# Patient Record
Sex: Female | Born: 1970 | Race: White | Hispanic: No | Marital: Married | Smoking: Never smoker
Health system: Southern US, Community
[De-identification: ages and names within clinical notes are randomized; demographics above are authoritative.]

## PROBLEM LIST (undated history)

## (undated) DIAGNOSIS — H9190 Unspecified hearing loss, unspecified ear: Secondary | ICD-10-CM

## (undated) DIAGNOSIS — G43909 Migraine, unspecified, not intractable, without status migrainosus: Secondary | ICD-10-CM

## (undated) DIAGNOSIS — T4145XA Adverse effect of unspecified anesthetic, initial encounter: Secondary | ICD-10-CM

## (undated) DIAGNOSIS — G5620 Lesion of ulnar nerve, unspecified upper limb: Secondary | ICD-10-CM

## (undated) DIAGNOSIS — M5020 Other cervical disc displacement, unspecified cervical region: Secondary | ICD-10-CM

## (undated) DIAGNOSIS — T8859XA Other complications of anesthesia, initial encounter: Secondary | ICD-10-CM

## (undated) HISTORY — DX: Migraine, unspecified, not intractable, without status migrainosus: G43.909

## (undated) HISTORY — DX: Lesion of ulnar nerve, unspecified upper limb: G56.20

## (undated) HISTORY — PX: WISDOM TOOTH EXTRACTION: SHX21

## (undated) HISTORY — DX: Other cervical disc displacement, unspecified cervical region: M50.20

## (undated) HISTORY — DX: Unspecified hearing loss, unspecified ear: H91.90

## (undated) HISTORY — PX: OTHER SURGICAL HISTORY: SHX169

---

## 2001-02-08 ENCOUNTER — Other Ambulatory Visit: Admission: RE | Admit: 2001-02-08 | Discharge: 2001-02-08 | Payer: Self-pay | Admitting: Obstetrics & Gynecology

## 2001-04-23 ENCOUNTER — Encounter: Payer: Self-pay | Admitting: Obstetrics and Gynecology

## 2001-04-23 ENCOUNTER — Ambulatory Visit (HOSPITAL_COMMUNITY): Admission: RE | Admit: 2001-04-23 | Discharge: 2001-04-23 | Payer: Self-pay | Admitting: Obstetrics and Gynecology

## 2001-09-08 ENCOUNTER — Inpatient Hospital Stay (HOSPITAL_COMMUNITY): Admission: AD | Admit: 2001-09-08 | Discharge: 2001-09-08 | Payer: Self-pay | Admitting: Obstetrics & Gynecology

## 2001-09-12 ENCOUNTER — Inpatient Hospital Stay (HOSPITAL_COMMUNITY): Admission: AD | Admit: 2001-09-12 | Discharge: 2001-09-16 | Payer: Self-pay | Admitting: Obstetrics & Gynecology

## 2002-06-13 ENCOUNTER — Other Ambulatory Visit: Admission: RE | Admit: 2002-06-13 | Discharge: 2002-06-13 | Payer: Self-pay | Admitting: Obstetrics & Gynecology

## 2004-08-18 ENCOUNTER — Other Ambulatory Visit: Admission: RE | Admit: 2004-08-18 | Discharge: 2004-08-18 | Payer: Self-pay | Admitting: Obstetrics & Gynecology

## 2005-05-16 HISTORY — PX: BREAST SURGERY: SHX581

## 2005-05-16 HISTORY — PX: AUGMENTATION MAMMAPLASTY: SUR837

## 2005-06-27 ENCOUNTER — Other Ambulatory Visit: Admission: RE | Admit: 2005-06-27 | Discharge: 2005-06-27 | Payer: Self-pay | Admitting: Obstetrics & Gynecology

## 2006-10-23 ENCOUNTER — Inpatient Hospital Stay (HOSPITAL_COMMUNITY): Admission: AD | Admit: 2006-10-23 | Discharge: 2006-10-23 | Payer: Self-pay | Admitting: Obstetrics and Gynecology

## 2006-10-23 ENCOUNTER — Encounter (INDEPENDENT_AMBULATORY_CARE_PROVIDER_SITE_OTHER): Payer: Self-pay | Admitting: Obstetrics and Gynecology

## 2007-10-24 ENCOUNTER — Inpatient Hospital Stay (HOSPITAL_COMMUNITY): Admission: RE | Admit: 2007-10-24 | Discharge: 2007-10-27 | Payer: Self-pay | Admitting: Obstetrics & Gynecology

## 2007-10-30 ENCOUNTER — Ambulatory Visit: Admission: RE | Admit: 2007-10-30 | Discharge: 2007-10-30 | Payer: Self-pay | Admitting: Obstetrics & Gynecology

## 2008-03-27 ENCOUNTER — Emergency Department (HOSPITAL_COMMUNITY): Admission: EM | Admit: 2008-03-27 | Discharge: 2008-03-27 | Payer: Self-pay | Admitting: Emergency Medicine

## 2008-04-27 IMAGING — US US OB TRANSVAGINAL MODIFY
1 series · 14 of 28 positions shown · non-contrast
Comparison: none

CLINICAL DATA: 10 weeks pregnant. Heavy vaginal bleeding

TRANSABDOMINAL AND TRANSVAGINAL OB ULTRASOUND, LESS THAN 14 WEEKS:

[Series 1: us ob transvaginal modify · 0.22mm/px · 14 of 33 slices shown]
[im 2/33]
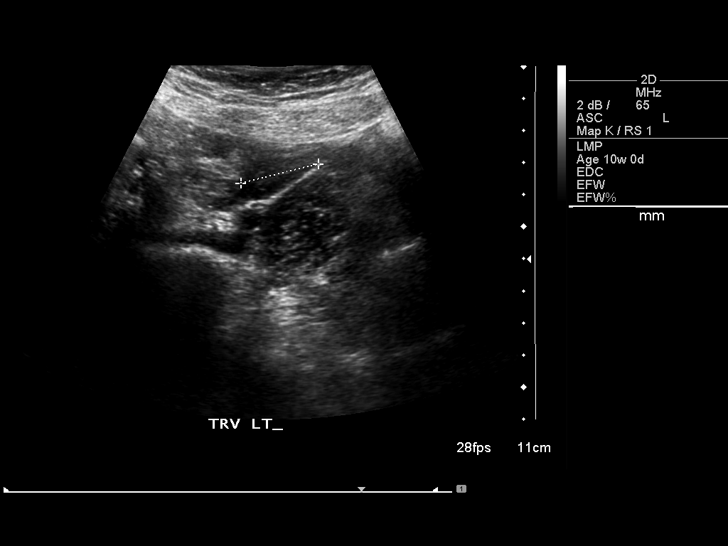
[im 4/33]
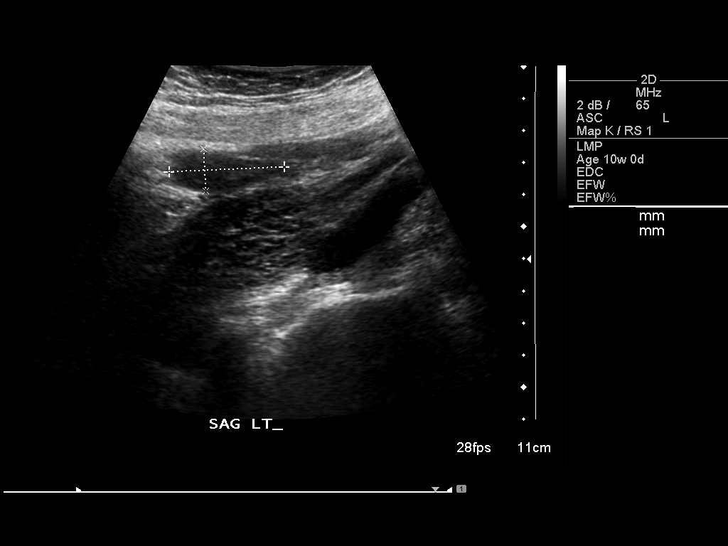
[im 6/33]
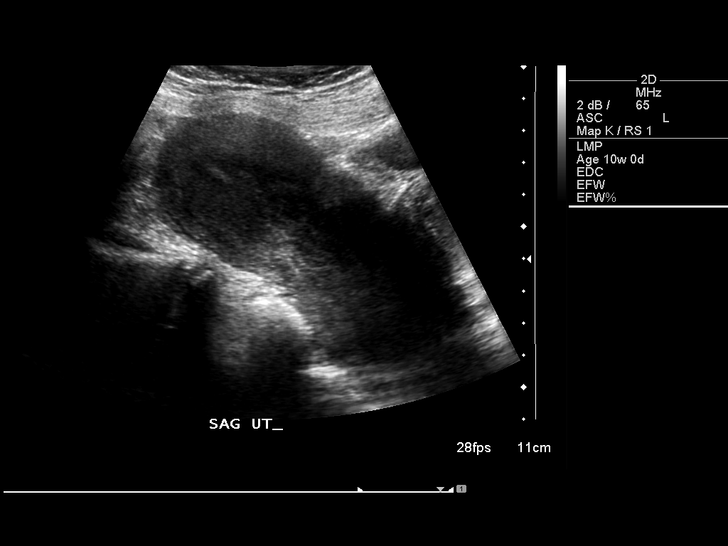
[im 9/33]
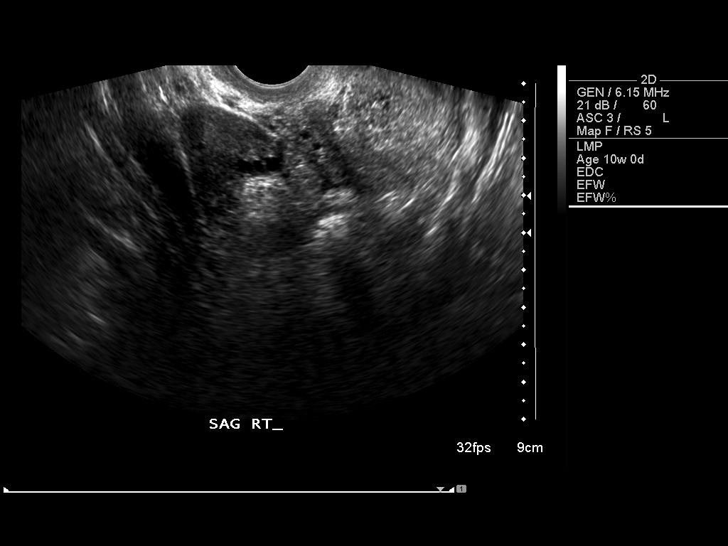
[im 11/33]
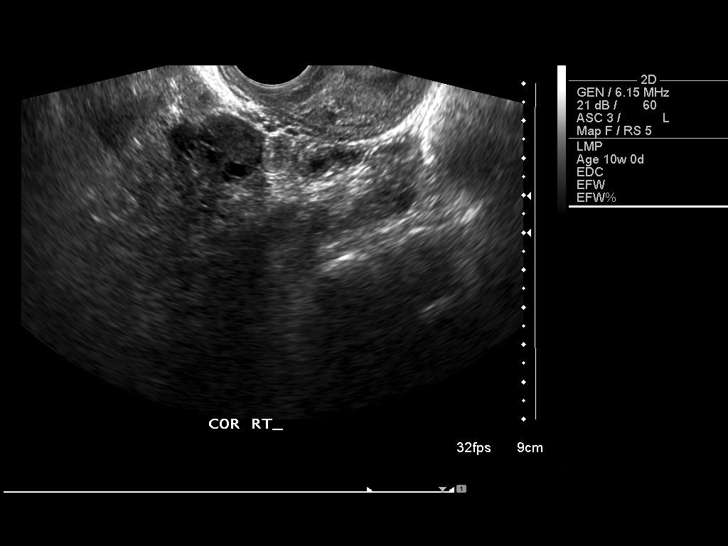
[im 14/33]
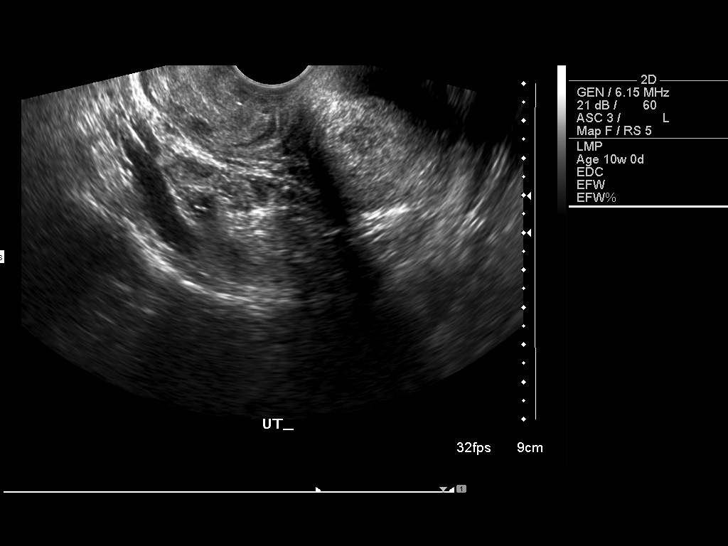
[im 16/33]
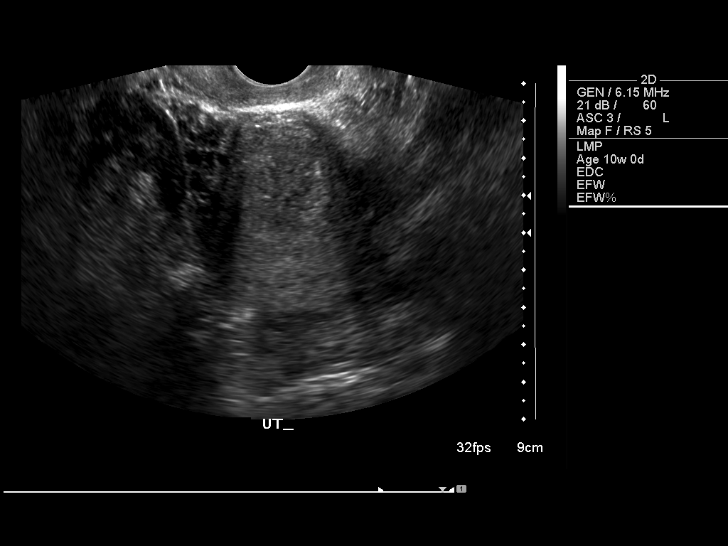
[im 18/33]
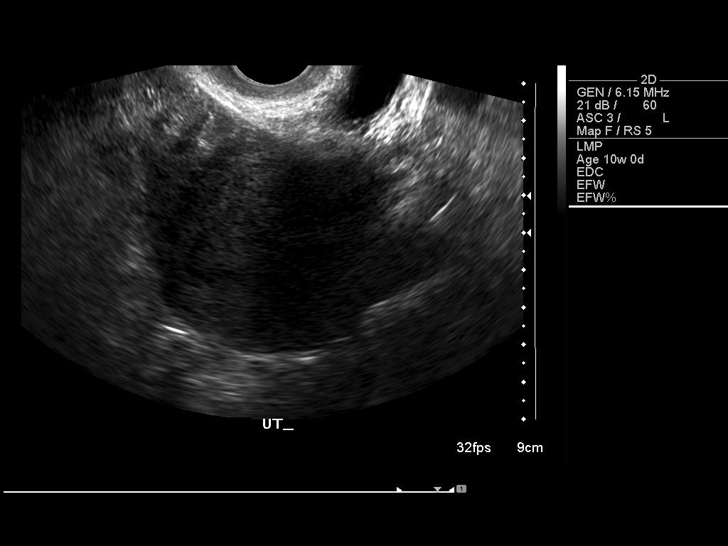
[im 21/33]
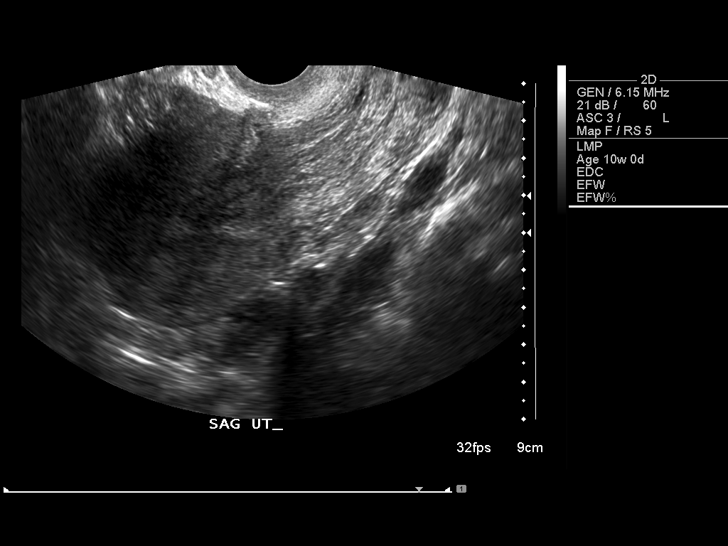
[im 23/33]
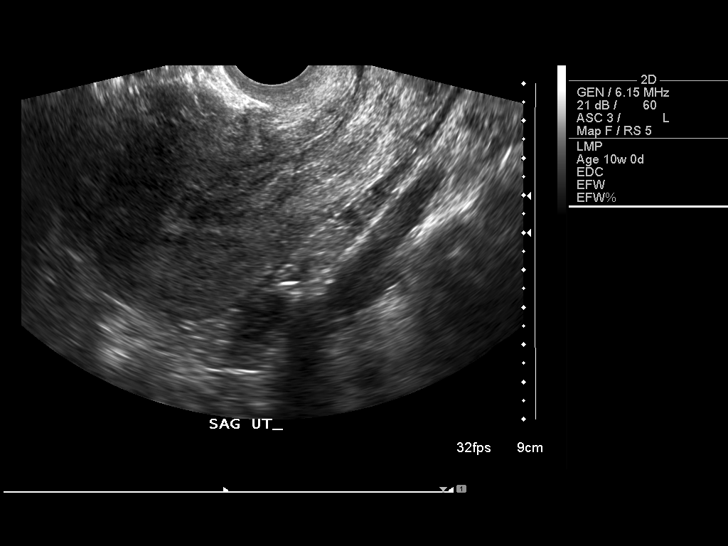
[im 25/33]
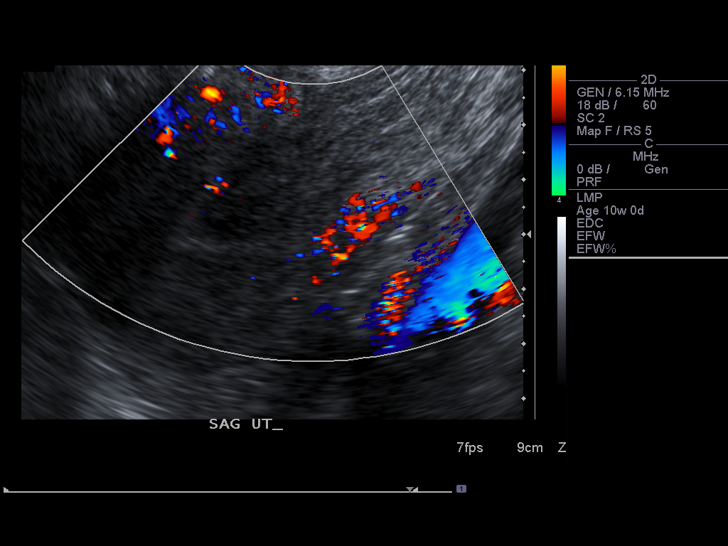
[im 28/33]
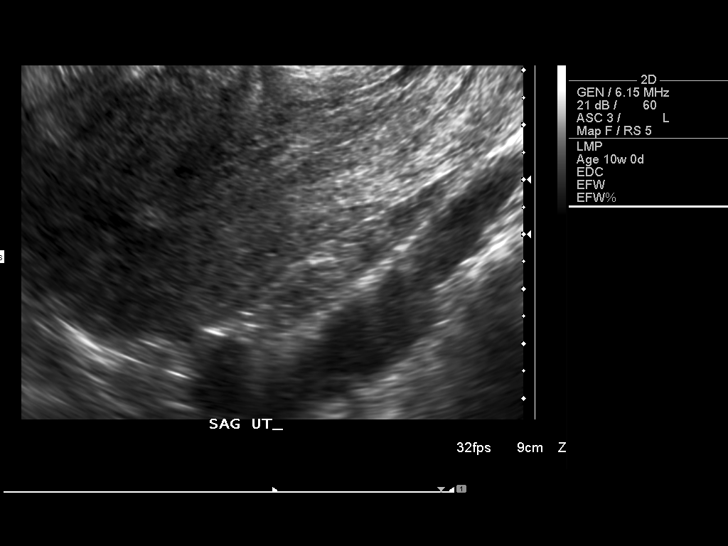
[im 30/33]
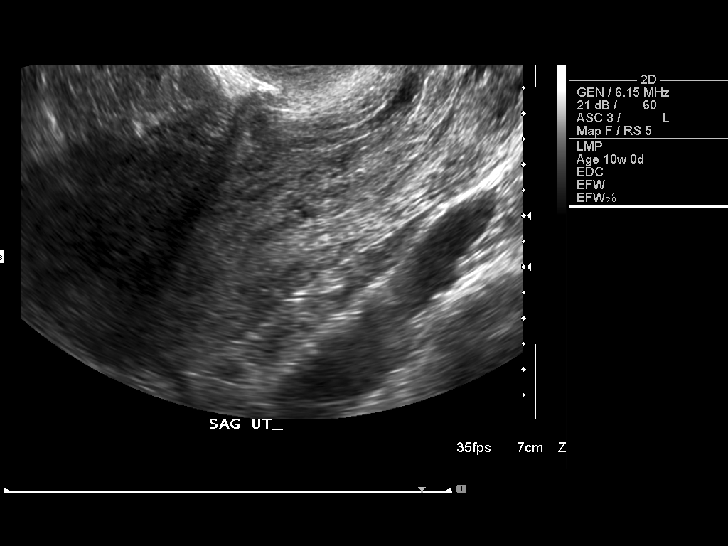
[im 33/33]
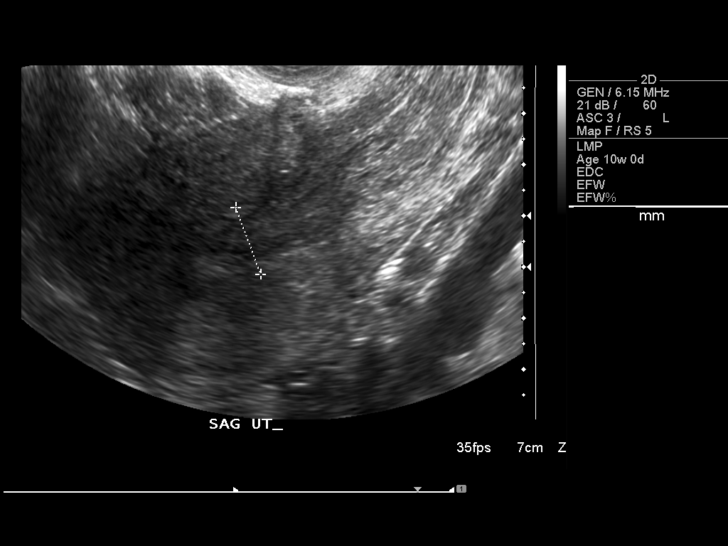

[14 of 28 positions shown; findings below may reference images not displayed]

FINDINGS: There is no intrauterine gestational sac identified. Endometrium
mildly thickened and heterogeneous at 16 mm.

Ovaries unremarkable. No free fluid.
IMPRESSION: No intrauterine gestation identified. Endometrium mildly thickened at 16 mm.
Recommend correlation with quantitative beta-hCG.

## 2009-09-30 IMAGING — CR DG ABDOMEN ACUTE W/ 1V CHEST
3 series · 3 of 3 positions shown · non-contrast
Comparison: None available.

CLINICAL DATA: Left lower quadrant pain.

ACUTE ABDOMEN SERIES (ABDOMEN 2 VIEW & CHEST 1 VIEW)

[w chest pa]
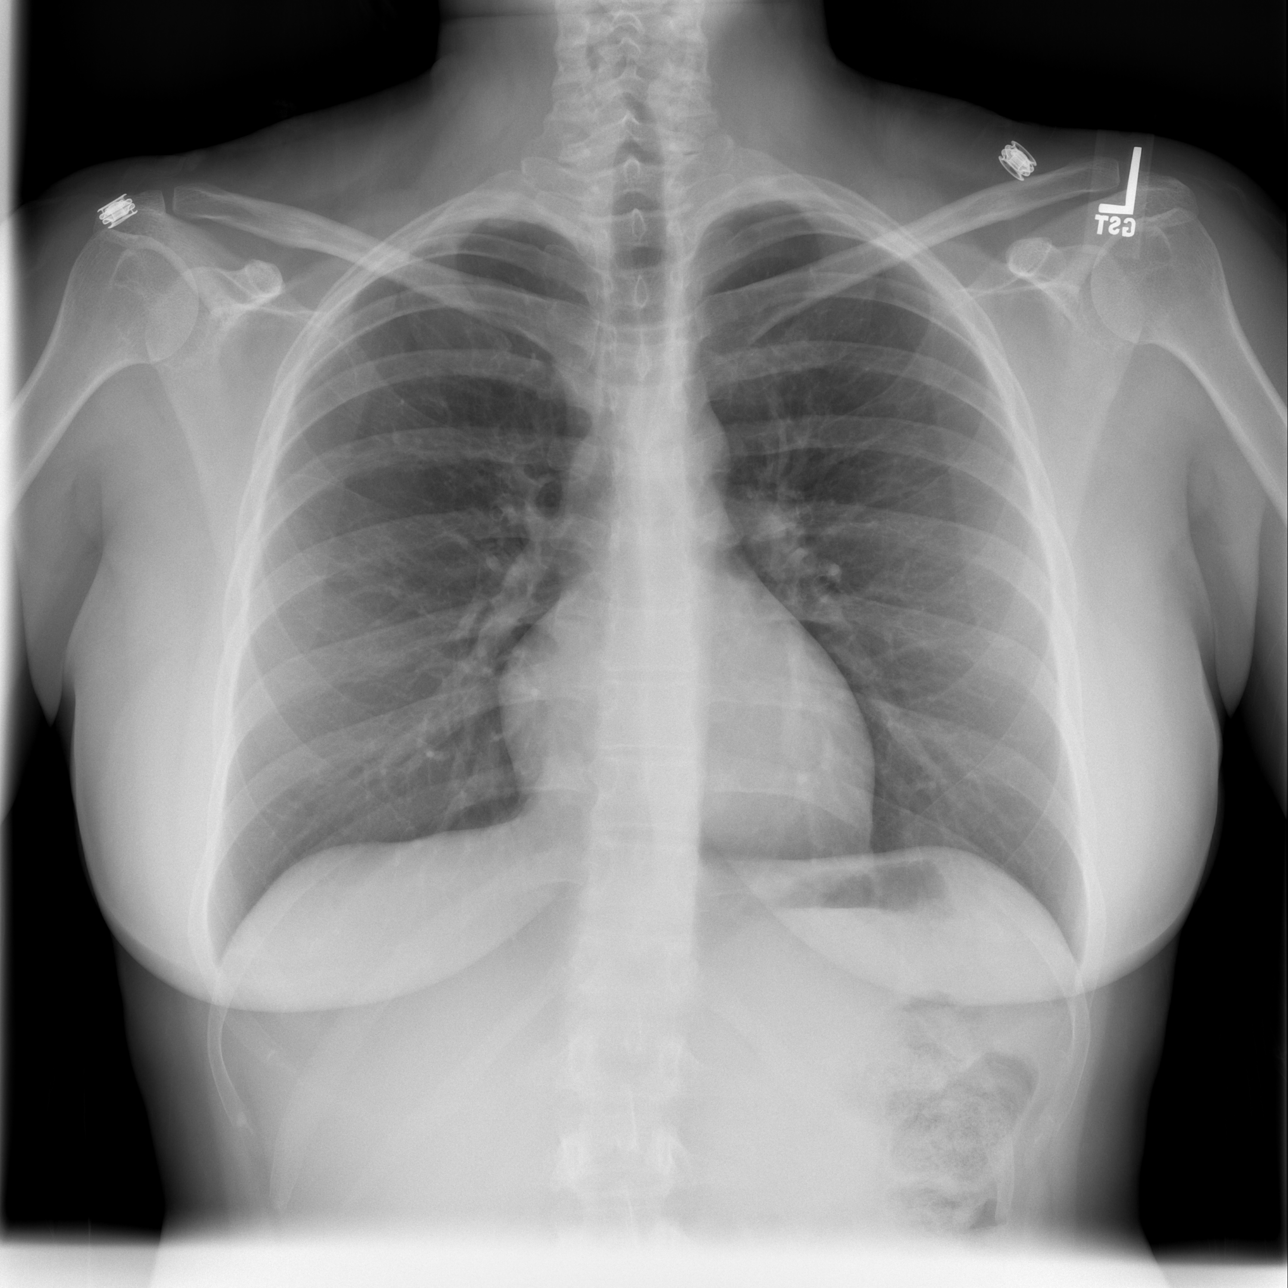

[w abdomen upright]
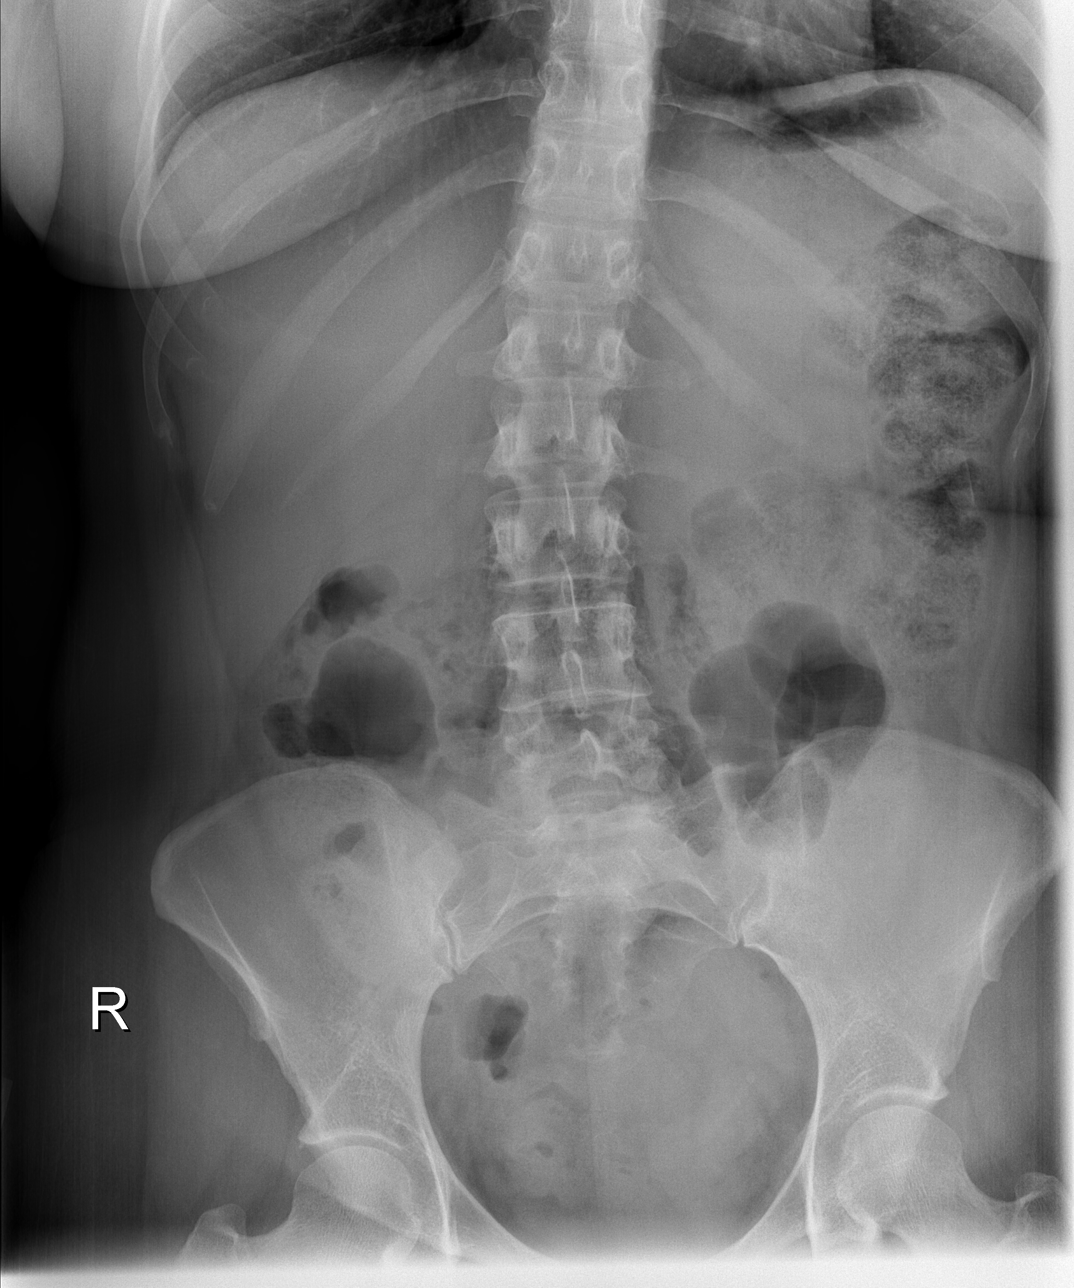

[t abdomen supine]
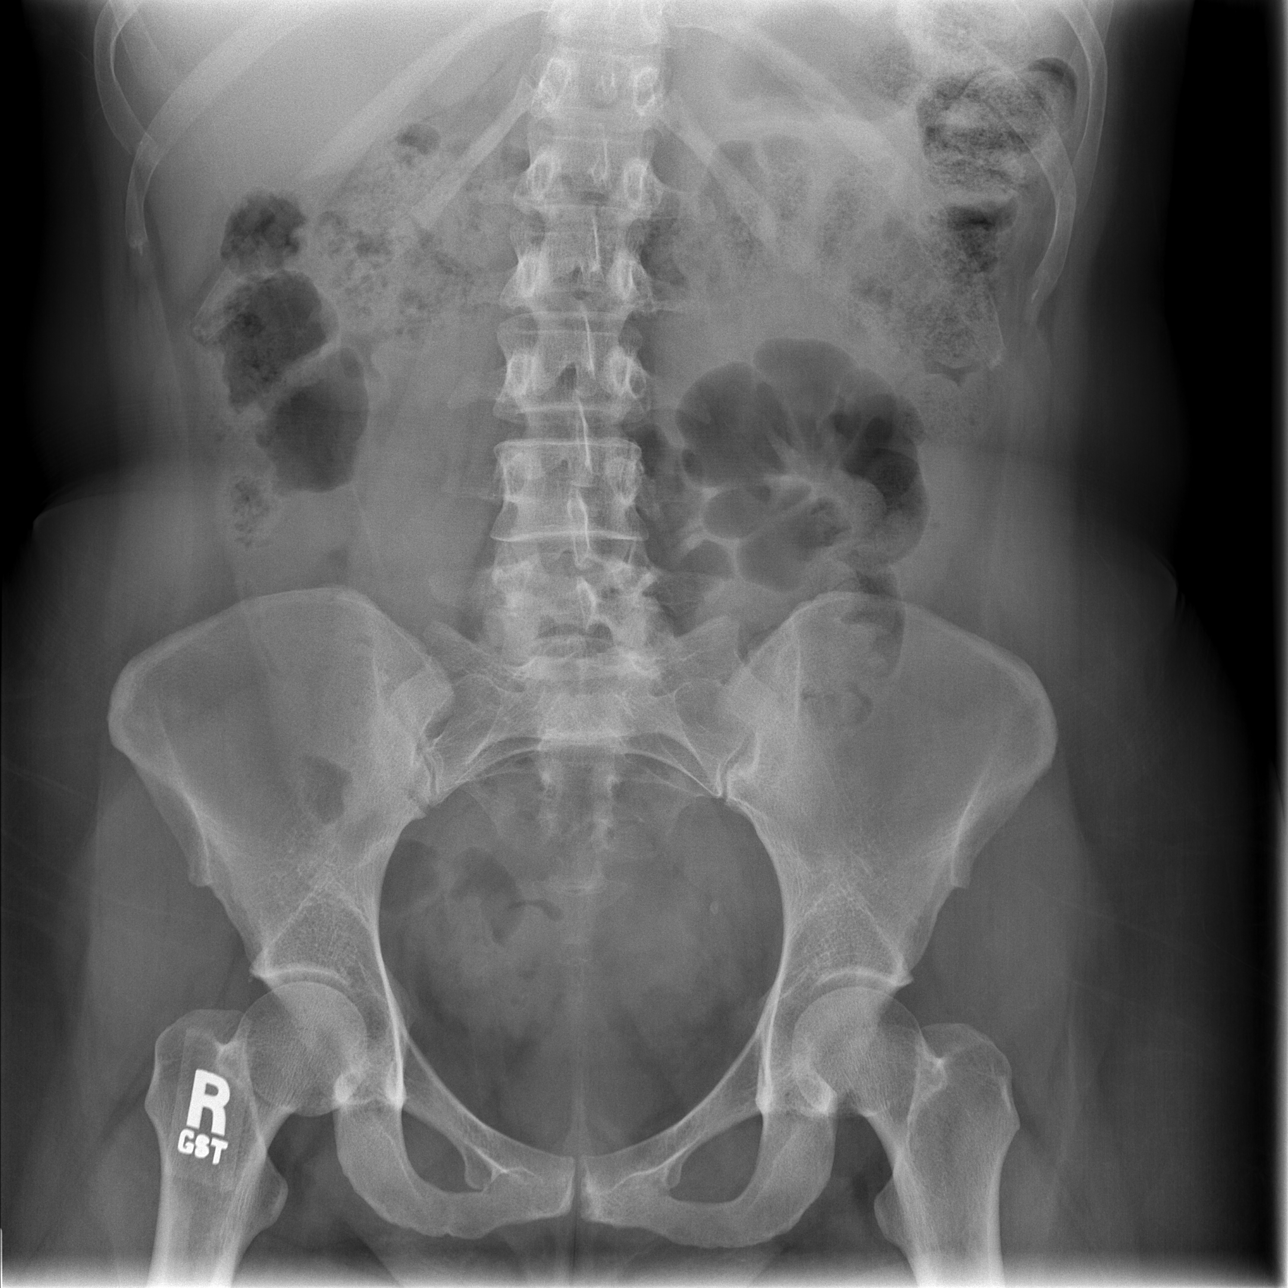

[3 of 3 positions shown; findings below may reference images not displayed]

FINDINGS: Single view of the chest demonstrates clear lungs and no
pleural effusion.  Heart size normal.

Two views the abdomen demonstrate no free intraperitoneal air.
Bowel gas pattern is unremarkable.
IMPRESSION: No acute finding.

## 2010-09-28 NOTE — Op Note (Signed)
Candace Mendez, Candace Mendez                ACCOUNT NO.:  [id number]   MEDICAL RECORD NO.:  [id number]          PATIENT TYPE:  INP   LOCATION:  9199                          FACILITY:  [facility]   PHYSICIAN:  Gerrit Friends. Aldona Bar, M.D.   DATE OF BIRTH:  1970-07-29   DATE OF PROCEDURE:  10/24/2007  DATE OF DISCHARGE:                               OPERATIVE REPORT   PREOPERATIVE DIAGNOSES:  1. Term pregnancy.  2. Previous cesarean section.  3. Desire for repeat cesarean section.   POSTOPERATIVE DIAGNOSES:  1. Term pregnancy.  2. Previous cesarean section.  3. Desire for repeat cesarean section.  4. Delivery of 7 pounds 12 ounces female infant with Apgars of 9 and      9.   PROCEDURE:  Repeat low-transverse cesarean section.   SURGEON:  Gerrit Friends. Aldona Bar, MD   ASSISTANT:  Luvenia Redden, MD   ANESTHESIA:  Spinal by Dr. Harvest Forest.   HISTORY:  This 40 year old patient, a gravida 5, para 1-0-4-3 is being  taken to the operating room at 39-40 weeks' gestation for repeat  cesarean section after having a relatively benign pregnancy.   PROCEDURE IN DETAIL:  Once in the operating room, spinal anesthetic was  carried out by Dr. Harvest Forest without difficulty and thereafter the patient  was prepped and draped in the usual fashion with a Foley catheter  inserted for the prep.   After the patient was appropriately draped and positioned and good  anesthetic levels were documented, procedure was begun.   A Pfannenstiel incision was made dissecting down to and through the old  scar without difficulty.  Subcu tissue was rendered hemostatic.  Fascia  incised in low-transverse fashion and subfascial space created  inferiorly and superiorly with hemostasis created as well.  Peritoneum  was identified appropriately and entered with care taken to avoid the  bowel superiorly and the bladder inferiorly.  Vesicouterine peritoneum  at this time was incised in a low-transverse fashion, pushed off the  lower uterine segment with  ease, and then sharp incision into the uterus  with the Metzenbaum scissors was made and extended laterally.  Amniotomy  produced and thereafter with delivery was expedited with the aid of the  vacuum extractor and the patient was delivered with 7 pounds 12 ounces  female infant with Apgars of 9 and 9 from vertex position.  Cord was  clamped and cut.  The infant was passed off to the waiting team and  subsequently taken to the nursery in good condition.  Cord bloods were  collected.  Placenta was delivered intact and appeared normal.  At this  time, the uterus was exteriorized, rendered free of any remaining  products of conception, and good uterine contractility was afforded with  slowly given intravenous Pitocin and manual stimulation.  At this time,  the uterine incision was closed using a single layer of #1 Vicryl in a  running locking fashion with excellent results.  Tubes and ovaries were  inspected and noted to be normal.  Abdomen lavaged of all free blood and  clot, and incision reinspected and noted to be  dry.  At this time, the  uterus was replaced in the abdominal incision after all counts were  noted to be correct and no foreign bodies were noted through the  remaining abdominal cavity.  Closure of the abdomen was carried out in  layers.  The abdominal peritoneum was closed with 0 Vicryl in a running  fashion and muscle secured with same.  Assured of good fascial  hemostasis, the fascia was then reapproximated using 0 Vicryl from angle  of midline bilaterally.  Subcutaneous tissues were hemostatic.  Staples  were then used to close the skin.  A sterile pressure dressing was  applied and the patient at this time was transported to recovery room in  satisfactory condition having tolerated the procedure well.  Estimated  blood loss 500 mL.  All counts correct x2.  At conclusion of procedure,  both mother and baby were doing well in the respective recovery areas.   In summary,  this patient presented at term, desires repeat cesarean  section, was taken to the operating room, and was delivered with 7  pounds 12 ounces female infant with Apgars of 9 and 9.      Gerrit Friends. Aldona Bar, M.D.  Electronically Signed     RMW/MEDQ  D:  10/24/2007  T:  10/24/2007  Job:  [id number]

## 2010-10-01 NOTE — Discharge Summary (Signed)
Candace Mendez, Candace Mendez                ACCOUNT NO.:  [id number]   MEDICAL RECORD NO.:  [id number]          PATIENT TYPE:  INP   LOCATION:  9142                          FACILITY:  [facility]   PHYSICIAN:  Malva Limes, M.D.    DATE OF BIRTH:  10/05/1970   DATE OF ADMISSION:  10/24/2007  DATE OF DISCHARGE:  10/27/2007                               DISCHARGE SUMMARY   FINAL DIAGNOSES:  1. Intrauterine pregnancy at term.  2. History of previous cesarean section, the patient desires repeat      cesarean section.   PROCEDURE:  Repeat low-transverse cesarean section.   SURGEON:  Dr. Gerrit Friends. Aldona Bar, MD.   ASSISTANT:  Dr. Tammy Sours.   COMPLICATIONS:  None.   This 40 year old G5, P 1-0-4-3 presents at term for repeat cesarean  section.  The patient had a prior cesarean section with her last  pregnancy and desires a repeat with this pregnancy as well.  The  patient's antepartum course up to this point had been complicated by  advanced maternal age.  She declined all genetic testing.  Otherwise,  her antepartum course was uncomplicated.  She did have a negative group  B strep culture obtained in our office at 35 weeks.  The patient was  taken to the operating room on October 24, 2007, by Dr. Annamaria Helling where a  repeat low-transverse cesarean section was performed with the delivery  of a 7 pounds 12 ounces female infant with Apgars of 9 and 9.  Delivery  went without complications.  The patient's postoperative course was  benign without any significant fevers.  She did have some postoperative  anemia and was started on iron with her discharge.  The patient was felt  ready for discharge on postoperative day #3.  She was sent home on a  regular diet, told to decrease activities, told to continue her  vitamins, and her iron supplement daily.  She was given Percocet 1-2  every 4-6 hours as needed for her pain.  Instructions and precautions  were reviewed with the patient.   LABS ON DISCHARGE:  The  patient had a hemoglobin of 7.2, white blood  cell count of 9.6, and platelets of 131,000 that was done on October 25, 2007.      Leilani Able, P.A.-C.    ______________________________  Malva Limes, M.D.    MB/MEDQ  D:  11/26/2007  T:  11/27/2007  Job:  [id number]

## 2010-10-01 NOTE — Discharge Summary (Signed)
[facility]  Patient:    [name], [name] Visit Number: [id number] MRN: [id number]          Service Type: OBS Location: 910A 9145 01 Attending Physician:  Lars Pinks Dictated by:   Leilani Able, P.A. Admit Date:  09/12/2001 Discharge Date: 09/16/2001                             Discharge Summary  FINAL DIAGNOSES:              1. Intrauterine pregnancy at term.                               2. Nonreassuring fetal heart tracing.  PROCEDURE:                    Primary low transverse cesarean section.  SURGEON:                      Gerrit Friends. Aldona Bar, M.D.  COMPLICATIONS:                None.  HOSPITAL COURSE:              This 40 year old, G1, P0 was admitted on the evening of September 12, 2001 in early active labor.  The patient was checked on the morning of Sep 13, 2001 and her cervix was essentially unchanged.  She was given the option of induction versus discharge to home and she chose the former. The patient underwent amniotomy with production of clear fluid and pressure catheters were placed. She began having good contractions at this point and progressed to about 6 cm dilation by 2:30 that afternoon.  A couple of hours later, the patient was noted to have some moderate variable decelerations with each contraction that was unresponsive to position change. At this point her cervix was still 6 cm dilated and negative 2 to negative 3 station.  Because of a concern for nonreassuring fetal heart tracing unresponsive to position change and fluid decision was made to proceed with a cesarean section.  The patient was taken to the operating room by Dr. Annamaria Helling on Sep 13, 2001 where a primary low transverse cesarean section was performed with the delivery of an 8 pound 4 ounce female infant with Apgars of 7 and 9.  Procedure went without complication.  The patients postoperative course was benign without significant fevers.  The patient did  have some postoperative anemia and was started on iron in the hospital.  A circumcision was made on the little boy and the patient was felt ready for discharge on postoperative day three.  She was sent home on a regular diet and told to decrease activities.  She was told to continue prenatal vitamins and FESO4 was given.  A prescription for Percocet and ibuprofen.  She was to follow up in the office in four weeks and to call with any increased pain or bleeding. Dictated by:   Leilani Able, P.A. Attending Physician:  Lars Pinks DD:  10/15/01 TD:  10/17/01 Job: [id number]

## 2010-10-01 NOTE — Op Note (Signed)
[facility]  Patient:    Candace Mendez, Candace Mendez Visit Number: [id number] MRN: [id number]          Service Type: OBS Location: 910A 9145 01 Attending Physician:  Lars Pinks Dictated by:   Gerrit Friends. Aldona Bar, M.D. Proc. Date: 09/13/01 Admit Date:  09/12/2001                             Operative Report  PREOPERATIVE DIAGNOSES:       Nonreassuring fetal heart tracing.  POSTOPERATIVE DIAGNOSES:      Nonreassuring fetal heart tracing, delivery of 8 pound 4 ounce female infant, Apgars 8 and 9.  PROCEDURE:                    Primary low transverse cesarean section.  SURGEON:                      Gerrit Friends. Aldona Bar, M.D.  ANESTHESIA:                   Spinal.  HISTORY:                      This primigravida was admitted on the evening of April 30 in what was felt to be early active labor.  When checked on the morning of May 1 her cervix essentially was unchanged.  She measured 41 cm and she was given the option of induction versus discharge to home.  The chose the former.  She underwent amniotomy with production of clear fluid and a pressure catheter was placed.  She began having, shortly thereafter, good contractions. She progressed to approximately 6 cm of dilatation at about 2:30 p.m.  I was called to see the patient at about 4:10 p.m. at which time she had been having moderate variable decelerations with each contraction unresponsive to position change and at that time her cervix essentially was unchanged from what it was around 2:30 p.m., still 6 cm dilated, 70% effaced, with a vertex at -2/-3 station.  Because of a concern for nonreassuring fetal heart tracing unresponsive to position change and fluid bolus, decision was made to recommend primary low transverse cesarean section for delivery which was accepted by the patient.  She was taken to the operating room for such procedure.  In the operating room she was prepped and draped after a spinal  anesthetic was placed by Dr. ______.  A Foley catheter was placed as well as part of the prep.  After the patient was appropriately positioned and the anesthetic level was checked, procedure was begun.  A Pfannenstiel incision was made and dissected down sharply to and through the fascia in a low transverse fashion without difficulty.  Subfascial space was created inferiorly and superiorly.  Muscles separated in the midline. Peritoneum identified and entered appropriately with care taken to avoid the bowel superiorly and the bladder inferiorly.  At this time the vesicouterine peritoneum was incised in a low transverse fashion and pushed off the lower uterine segment with ease.  Sharp ______ of the uterus to Metzenbaum scissors was then carried out in a low transverse fashion, extended laterally with the fingers.  Thereafter with the aid of the vacuum extractor, a viable female infant which cried spontaneously at once was delivered.  There was some difficulty with the shoulders.  After the cord was clamped and cut the infant was passed off  the awaiting team.  Apgars were noted to be 8 and 9.  The baby was ultimately taken to the nursery in good condition.  Weight was found to be 8 pounds 4 ounces.  No evidence of a cord problem was seen at the time of delivery.  Once the placenta was delivered intact the uterus was manually explored, found to be negative for any remaining products of conception and the uterus was exteriorized to aid in closure of the uterine incision.  Good uterine contractility was afforded with slowly given intravenous Pitocin and manual stimulation.  The uterine incision was closed with a single layer of #1 Vicryl in a running locking fashion and oversewn with several figure-of-eight #1 Vicryl.  Hemostasis was noted to be adequate.  Tubes and ovaries were noted to be normal.  Uterus was well contracted.  Incision was dry.  At this time the incision was replaced into  the abdominal cavity after the abdomen was lavaged of all free blood and clot.  At this point all counts were noted to be correct and no foreign bodies were noted to be remaining in the abdominal cavity. Closure of the abdomen was begun at this time in layers.  The abdominoperitoneum was closed with 0 Vicryl in a running fashion.  Muscles were secured with same.  Assured of good subfascial hemostasis, the fascia was then reapproximated using 0 Vicryl flank with midline bilaterally. Subcutaneous tissue was rendered hemostatic and staples were then used to close the skin.  Sterile pressure dressing was applied and the patient at this time was transported to recovery in satisfactory condition having tolerated procedure well.  ESTIMATED BLOOD LOSS:         500 cc.  COUNTS:                       Correct x2.  SUMMARY:                      This patient progressed to about 6 cm of dilatation with no further change for about two hours and for the last hour prior to being taken to the operating room was having some moderately significant variables which seemed to be totally unresponsive to position change and fluid bolus.  Because of nonreassuring tracing she was taken to the operating room for primary low transverse cesarean section.  At the conclusion of the procedure both mother and baby were doing well in their respective recovery areas. Dictated by:   Gerrit Friends. Aldona Bar, M.D. Attending Physician:  Lars Pinks DD:  09/13/01 TD:  09/16/01 Job: [id number]

## 2010-11-15 NOTE — H&P (Signed)
Candace Mendez is a 40 y.o. female presenting for repeat C-section at Term. Maternal Medical History:  Reason for admission: Presenting for Repeat C-section at Term.  Fetal activity: Perceived fetal activity is normal.    Prenatal complications: no prenatal complications Prenatal Complications - Diabetes: none.    OB History    No data available     No past medical history on file. No past surgical history on file. Family History: family history is not on file. Social History:  does not have a smoking history on file. She does not have any smokeless tobacco history on file. Her alcohol and drug histories not on file.  Review of Systems  Constitutional: Negative.   HENT: Negative.   Eyes: Negative.   Respiratory: Negative.   Cardiovascular: Negative.   Gastrointestinal: Negative.   Genitourinary: Negative.   Musculoskeletal: Negative.   Skin: Negative.   Neurological: Negative.   Endo/Heme/Allergies: Negative.   Psychiatric/Behavioral: Negative.      There were no vitals taken for this visit. Maternal Exam:  Uterine Assessment: No contractions  Abdomen: Patient reports no abdominal tenderness. Surgical scars: low transverse.   Fundal height is S+37.   Estimated fetal weight is 7 pounds.   Fetal presentation: vertex  Introitus: Normal vulva. Normal vagina.  Ferning test: not done.  Nitrazine test: not done. Amniotic fluid character: not assessed.  Cervix: not evaluated.   Physical Exam  Constitutional: She is oriented to person, place, and time. She appears well-developed and well-nourished.  HENT:  Head: Normocephalic.  Eyes: EOM are normal. Pupils are equal, round, and reactive to light.  Neck: Normal range of motion. Neck supple.  Cardiovascular: Normal rate, regular rhythm, normal heart sounds and intact distal pulses.   Respiratory: Effort normal and breath sounds normal.  GI: Soft. Bowel sounds are normal.  Genitourinary: Vagina normal and uterus  normal.  Musculoskeletal: Normal range of motion.  Neurological: She is alert and oriented to person, place, and time. She has normal reflexes.  Skin: Skin is warm and dry.  Psychiatric: She has a normal mood and affect. Her behavior is normal. Judgment and thought content normal.    Prenatal labs: ABO, Rh:   Antibody:   Rubella:   RPR:    HBsAg:    HIV:    GBS:     Assessment/Plan: Previous C-Section at Term/Repeat C-Section  Orbin Mayeux M 11/15/2010, 8:52 PM

## 2010-11-16 ENCOUNTER — Encounter (HOSPITAL_COMMUNITY): Payer: Self-pay | Admitting: Obstetrics & Gynecology

## 2010-11-23 ENCOUNTER — Other Ambulatory Visit (HOSPITAL_COMMUNITY): Payer: Self-pay

## 2010-11-23 ENCOUNTER — Encounter (HOSPITAL_COMMUNITY): Payer: Self-pay | Admitting: Obstetrics & Gynecology

## 2010-11-23 NOTE — H&P (Addendum)
MIYONNA ORMISTON is a 40 y.o. female G7 P2 (2previous C/S) presenting for repeat C/S at Term.  Her prenatal course has been totally benign. History  Family History: Non contributory Social History: Non Contributory   Review of Systems  Constitutional: Negative.   HENT: Negative.   Eyes: Negative.   Respiratory: Negative.   Cardiovascular: Negative.   Gastrointestinal: Negative.   Genitourinary: Negative.   Musculoskeletal: Negative.   Skin: Negative.   Neurological: Negative.   Psychiatric/Behavioral: Negative.       VS:  BP 100/60, P 80, R 18, Weight 157 lbs.  Maternal Exam:  Abdomen: Patient reports no abdominal tenderness. Surgical scars: low transverse.   Fundal height is 37.   Estimated fetal weight is 7 POUNDS.   Fetal presentation: vertex  Introitus: Normal vulva. Normal vagina.  Cervix: Cervix evaluated by digital exam.     Physical Exam  Constitutional: She is oriented to person, place, and time. She appears well-developed and well-nourished.  HENT:  Head: Normocephalic.  Eyes: Conjunctivae and EOM are normal. Pupils are equal, round, and reactive to light.  Neck: Normal range of motion. Neck supple.  Cardiovascular: Normal rate, regular rhythm, normal heart sounds and intact distal pulses.   Respiratory: Effort normal and breath sounds normal.  GI: Soft. Bowel sounds are normal.  Genitourinary: Vagina normal and uterus normal.  Musculoskeletal: Normal range of motion.  Neurological: She is alert and oriented to person, place, and time. She has normal reflexes.  Skin: Skin is warm and dry.  Psychiatric: She has a normal mood and affect. Her behavior is normal. Judgment and thought content normal.    Prenatal labs: ABO, Rh: O+  Antibody:  Neg Rubella:  Immune RPR:  NR  HBsAg:   Neg HIV:   NR GBS:  Neg   Assessment/Plan: Term Pregnancy, Previous C/S times 2.   Caprice Beaver 11/23/2010, 1:59 PM

## 2010-11-24 ENCOUNTER — Encounter (HOSPITAL_COMMUNITY): Payer: Self-pay

## 2010-11-24 ENCOUNTER — Encounter (HOSPITAL_COMMUNITY)
Admission: RE | Admit: 2010-11-24 | Discharge: 2010-11-24 | Disposition: A | Discharge status: Home / Self Care | Payer: 59 | Source: Ambulatory Visit | Attending: Obstetrics & Gynecology | Admitting: Obstetrics & Gynecology

## 2010-11-24 DIAGNOSIS — Z331 Pregnant state, incidental: Secondary | ICD-10-CM

## 2010-11-24 HISTORY — DX: Adverse effect of unspecified anesthetic, initial encounter: T41.45XA

## 2010-11-24 HISTORY — DX: Other complications of anesthesia, initial encounter: T88.59XA

## 2010-11-24 LAB — CBC
HCT: 35.5 % — ABNORMAL LOW (ref 36.0–46.0)
Hemoglobin: 12.2 g/dL (ref 12.0–15.0)
MCH: 31.3 pg (ref 26.0–34.0)
MCV: 91 fL (ref 78.0–100.0)
RBC: 3.9 MIL/uL (ref 3.87–5.11)

## 2010-11-24 NOTE — Anesthesia Preprocedure Evaluation (Signed)
Anesthesia Evaluation  General Assessment Comment  History of Anesthesia Complications (+) PONV  Airway Mallampati: I TM Distance: >3 FB Neck ROM: Full    Dental No notable dental hx    Pulmonaryneg pulmonary ROS      pulmonary exam normal   Cardiovascular Exercise Tolerance: Good Regular Normal   Neuro/PsychNegative Neurological ROS Negative Psych ROS  GI/Hepatic/Renal negative Liver ROS, and negative Renal ROS (+)  GERD (with pregnancy only. Controlled with TUMS)      Endo/Other  Negative Endocrine ROS (+)   Abdominal   Musculoskeletal negative musculoskeletal ROS (+)  Hematology negative hematology ROS (+)   Peds  Reproductive/Obstetrics negative OB ROS   Anesthesia Other Findings             Anesthesia Physical Anesthesia Plan  ASA: II  Anesthesia Plan: Spinal   Post-op Pain Management:    Induction:   Airway Management Planned:   Additional Equipment:   Intra-op Plan:   Post-operative Plan:   Informed Consent: I have reviewed the patients History and Physical, chart, labs and discussed the procedure including the risks, benefits and alternatives for the proposed anesthesia with the patient or authorized representative who has indicated his/her understanding and acceptance.     Plan Discussed with: Anesthesiologist (AP)  Anesthesia Plan Comments:         Anesthesia Quick Evaluation

## 2010-11-24 NOTE — Patient Instructions (Addendum)
20 TIMIYA HOWELLS  11/24/2010   Your procedure is scheduled on: 11/29/2010  Report to [facility] at 6 AM.  Call this number if you have problems the morning of surgery: [phone]   Remember:   Do not eat food:After Midnight.  Do not drink clear liquids: After Midnight.  Take these medicines the morning of surgery with A SIP OF WATER: N/A   Do not wear jewelry, make-up or nail polish.  Do not bring valuables to the hospital.  Contacts, dentures or bridgework may not be worn into surgery.  Leave suitcase in the car. After surgery it may be brought to your room.  For patients admitted to the hospital, checkout time is 11:00 AM the day of discharge.   Patients discharged the day of surgery will not be allowed to drive home.  Name and phone number of your driver: N/A  Special Instructions:    Please read over the following fact sheets that you were given: Pain Booklet and Surgical Site Infection Prevention

## 2010-11-29 ENCOUNTER — Inpatient Hospital Stay (HOSPITAL_COMMUNITY): Payer: 59 | Admitting: Anesthesiology

## 2010-11-29 ENCOUNTER — Inpatient Hospital Stay (HOSPITAL_COMMUNITY): Admission: AD | Admit: 2010-11-29 | Payer: 59 | Source: Ambulatory Visit | Admitting: Obstetrics & Gynecology

## 2010-11-29 ENCOUNTER — Encounter (HOSPITAL_COMMUNITY): Payer: Self-pay | Admitting: Anesthesiology

## 2010-11-29 ENCOUNTER — Encounter (HOSPITAL_COMMUNITY)
Admission: AD | Disposition: A | Discharge status: Home / Self Care | Payer: Self-pay | Source: Ambulatory Visit | Attending: Obstetrics and Gynecology

## 2010-11-29 ENCOUNTER — Inpatient Hospital Stay (HOSPITAL_COMMUNITY)
Admission: AD | Admit: 2010-11-29 | Discharge: 2010-12-01 | DRG: 766 | Disposition: A | Discharge status: Home / Self Care | Payer: 59 | Source: Ambulatory Visit | Attending: Obstetrics and Gynecology | Admitting: Obstetrics and Gynecology

## 2010-11-29 ENCOUNTER — Encounter (HOSPITAL_COMMUNITY): Payer: Self-pay | Admitting: *Deleted

## 2010-11-29 ENCOUNTER — Inpatient Hospital Stay (HOSPITAL_COMMUNITY): Admission: AD | Admit: 2010-11-29 | Payer: Self-pay | Source: Home / Self Care | Admitting: Obstetrics & Gynecology

## 2010-11-29 DIAGNOSIS — Z98891 History of uterine scar from previous surgery: Secondary | ICD-10-CM

## 2010-11-29 DIAGNOSIS — Z01818 Encounter for other preprocedural examination: Secondary | ICD-10-CM

## 2010-11-29 DIAGNOSIS — O9903 Anemia complicating the puerperium: Secondary | ICD-10-CM | POA: Diagnosis not present

## 2010-11-29 DIAGNOSIS — D649 Anemia, unspecified: Secondary | ICD-10-CM | POA: Diagnosis not present

## 2010-11-29 DIAGNOSIS — Z331 Pregnant state, incidental: Secondary | ICD-10-CM

## 2010-11-29 DIAGNOSIS — Z01812 Encounter for preprocedural laboratory examination: Secondary | ICD-10-CM

## 2010-11-29 DIAGNOSIS — O34219 Maternal care for unspecified type scar from previous cesarean delivery: Principal | ICD-10-CM | POA: Diagnosis present

## 2010-11-29 LAB — CBC
HCT: 33.6 % — ABNORMAL LOW (ref 36.0–46.0)
MCHC: 34.8 g/dL (ref 30.0–36.0)
Platelets: 127 10*3/uL — ABNORMAL LOW (ref 150–400)
RDW: 13.6 % (ref 11.5–15.5)
WBC: 9.3 10*3/uL (ref 4.0–10.5)

## 2010-11-29 LAB — RUBELLA ANTIBODY, IGM: Rubella: IMMUNE

## 2010-11-29 LAB — ABO/RH

## 2010-11-29 LAB — POCT FERN TEST: Fern Test: POSITIVE

## 2010-11-29 LAB — RPR: RPR: NONREACTIVE

## 2010-11-29 LAB — HEPATITIS B SURFACE ANTIGEN: Hepatitis B Surface Ag: NEGATIVE

## 2010-11-29 SURGERY — Surgical Case
Anesthesia: Spinal | Site: Abdomen | Wound class: Clean Contaminated

## 2010-11-29 MED ORDER — IBUPROFEN 600 MG PO TABS
600.0000 mg | ORAL_TABLET | Freq: Four times a day (QID) | ORAL | Status: DC
  Administered 2010-11-29 – 2010-12-01 (×9): 600 mg via ORAL
  Filled 2010-11-29 (×9): qty 1

## 2010-11-29 MED ORDER — PENICILLIN G POTASSIUM 5000000 UNITS IJ SOLR
5.0000 10*6.[IU] | Freq: Once | INTRAVENOUS | Status: AC
  Administered 2010-11-29: 5 10*6.[IU] via INTRAVENOUS
  Filled 2010-11-29: qty 5

## 2010-11-29 MED ORDER — OXYTOCIN 20 UNITS IN LACTATED RINGERS INFUSION - SIMPLE
125.0000 mL/h | INTRAVENOUS | Status: AC
  Administered 2010-11-29: 125 mL/h via INTRAVENOUS
  Filled 2010-11-29: qty 1000

## 2010-11-29 MED ORDER — WITCH HAZEL-GLYCERIN EX PADS: MEDICATED_PAD | CUTANEOUS | Status: DC | PRN

## 2010-11-29 MED ORDER — SENNOSIDES-DOCUSATE SODIUM 8.6-50 MG PO TABS
1.0000 | ORAL_TABLET | Freq: Every day | ORAL | Status: DC
  Administered 2010-11-29 – 2010-11-30 (×2): 1 via ORAL

## 2010-11-29 MED ORDER — DIPHENHYDRAMINE HCL 25 MG PO CAPS
25.0000 mg | ORAL_CAPSULE | Freq: Four times a day (QID) | ORAL | Status: DC | PRN
  Administered 2010-11-29: 25 mg via ORAL
  Filled 2010-11-29: qty 1

## 2010-11-29 MED ORDER — SCOPOLAMINE 1 MG/3DAYS TD PT72
MEDICATED_PATCH | TRANSDERMAL | Status: AC
  Administered 2010-11-29: 1.5 mg
  Filled 2010-11-29: qty 1

## 2010-11-29 MED ORDER — ZOLPIDEM TARTRATE 5 MG PO TABS: 5.0000 mg | ORAL_TABLET | Freq: Every evening | ORAL | Status: DC | PRN

## 2010-11-29 MED ORDER — OXYTOCIN 10 UNIT/ML IJ SOLN
INTRAMUSCULAR | Status: DC | PRN
  Administered 2010-11-29 (×2): 20 [IU] via INTRAMUSCULAR

## 2010-11-29 MED ORDER — ONDANSETRON HCL 4 MG/2ML IJ SOLN
4.0000 mg | INTRAMUSCULAR | Status: DC | PRN
  Administered 2010-11-29: 4 mg via INTRAVENOUS
  Filled 2010-11-29: qty 2

## 2010-11-29 MED ORDER — SIMETHICONE 80 MG PO CHEW: 80.0000 mg | CHEWABLE_TABLET | ORAL | Status: DC | PRN

## 2010-11-29 MED ORDER — BUPIVACAINE HCL 0.75 % IJ SOLN
INTRAMUSCULAR | Status: DC | PRN
  Administered 2010-11-29: 15 mg

## 2010-11-29 MED ORDER — PHENYLEPHRINE HCL 10 MG/ML IJ SOLN
INTRAMUSCULAR | Status: DC | PRN
  Administered 2010-11-29: 40 ug via INTRAVENOUS

## 2010-11-29 MED ORDER — SIMETHICONE 80 MG PO CHEW
80.0000 mg | CHEWABLE_TABLET | Freq: Three times a day (TID) | ORAL | Status: DC
  Administered 2010-11-29 – 2010-12-01 (×8): 80 mg via ORAL

## 2010-11-29 MED ORDER — LACTATED RINGERS IV SOLN
INTRAVENOUS | Status: DC
  Administered 2010-11-29 (×3): via INTRAVENOUS

## 2010-11-29 MED ORDER — OXYCODONE-ACETAMINOPHEN 5-325 MG PO TABS
1.0000 | ORAL_TABLET | ORAL | Status: DC | PRN
  Administered 2010-11-30: 1 via ORAL
  Administered 2010-12-01: 2 via ORAL
  Administered 2010-12-01 (×2): 1 via ORAL
  Filled 2010-11-29 (×6): qty 2
  Filled 2010-11-29: qty 1

## 2010-11-29 MED ORDER — CEFAZOLIN SODIUM 1-5 GM-% IV SOLN
INTRAVENOUS | Status: DC | PRN
  Administered 2010-11-29: 1 g via INTRAVENOUS

## 2010-11-29 MED ORDER — MENTHOL 3 MG MT LOZG: 1.0000 | LOZENGE | OROMUCOSAL | Status: DC | PRN

## 2010-11-29 MED ORDER — CITRIC ACID-SODIUM CITRATE 334-500 MG/5ML PO SOLN
30.0000 mL | Freq: Once | ORAL | Status: AC
  Administered 2010-11-29: 30 mL via ORAL
  Filled 2010-11-29: qty 15

## 2010-11-29 MED ORDER — PRENATAL PLUS 27-1 MG PO TABS
1.0000 | ORAL_TABLET | Freq: Every day | ORAL | Status: DC
  Administered 2010-11-30 – 2010-12-01 (×2): 1 via ORAL
  Filled 2010-11-29 (×2): qty 1

## 2010-11-29 MED ORDER — MORPHINE SULFATE (PF) 0.5 MG/ML IJ SOLN
INTRAMUSCULAR | Status: DC | PRN
  Administered 2010-11-29: .15 mg via INTRATHECAL

## 2010-11-29 MED ORDER — PENICILLIN G POTASSIUM 5000000 UNITS IJ SOLR
2.5000 10*6.[IU] | Freq: Every day | INTRAVENOUS | Status: DC
  Filled 2010-11-29 (×4): qty 2.5

## 2010-11-29 MED ORDER — TETANUS-DIPHTH-ACELL PERTUSSIS 5-2.5-18.5 LF-MCG/0.5 IM SUSP
0.5000 mL | Freq: Once | INTRAMUSCULAR | Status: DC
  Filled 2010-11-29: qty 0.5

## 2010-11-29 MED ORDER — FENTANYL CITRATE 0.05 MG/ML IJ SOLN
INTRAMUSCULAR | Status: DC | PRN
  Administered 2010-11-29: 25 ug via INTRATHECAL

## 2010-11-29 MED ORDER — ONDANSETRON HCL 4 MG/2ML IJ SOLN
INTRAMUSCULAR | Status: DC | PRN
  Administered 2010-11-29: 4 mg via INTRAVENOUS

## 2010-11-29 MED ORDER — LACTATED RINGERS IV SOLN
500.0000 mL | INTRAVENOUS | Status: AC | PRN
  Administered 2010-11-29: 1000 mL via INTRAVENOUS

## 2010-11-29 SURGICAL SUPPLY — 33 items
CLOTH BEACON ORANGE TIMEOUT ST (SAFETY) ×2 IMPLANT
DRAPE UTILITY XL STRL (DRAPES) ×2 IMPLANT
DRESSING TELFA 8X3 (GAUZE/BANDAGES/DRESSINGS) ×2 IMPLANT
ELECT REM PT RETURN 9FT ADLT (ELECTROSURGICAL) ×2
ELECTRODE REM PT RTRN 9FT ADLT (ELECTROSURGICAL) ×1 IMPLANT
EXTRACTOR VACUUM M CUP 4 TUBE (SUCTIONS) IMPLANT
GAUZE SPONGE 4X4 12PLY STRL LF (GAUZE/BANDAGES/DRESSINGS) ×3 IMPLANT
GLOVE BIO SURGEON STRL SZ7.5 (GLOVE) ×4 IMPLANT
GLOVE ECLIPSE 6.0 STRL STRAW (GLOVE) ×1 IMPLANT
GLOVE INDICATOR 6.5 STRL GRN (GLOVE) ×1 IMPLANT
GLOVE SURG SS PI 7.0 STRL IVOR (GLOVE) ×1 IMPLANT
GOWN BRE IMP SLV AUR LG STRL (GOWN DISPOSABLE) ×4 IMPLANT
GOWN BRE IMP SLV AUR XL STRL (GOWN DISPOSABLE) ×2 IMPLANT
KIT ABG SYR 3ML LUER SLIP (SYRINGE) IMPLANT
NDL HYPO 25X5/8 SAFETYGLIDE (NEEDLE) IMPLANT
NEEDLE HYPO 25X5/8 SAFETYGLIDE (NEEDLE) IMPLANT
NS IRRIG 1000ML POUR BTL (IV SOLUTION) ×2 IMPLANT
PACK C SECTION WH (CUSTOM PROCEDURE TRAY) ×2 IMPLANT
PAD ABD 7.5X8 STRL (GAUZE/BANDAGES/DRESSINGS) ×2 IMPLANT
SLEEVE SCD COMPRESS KNEE MED (MISCELLANEOUS) IMPLANT
STAPLER VISISTAT 35W (STAPLE) ×1 IMPLANT
SUT PLAIN 1 NONE 54 (SUTURE) ×2 IMPLANT
SUT PROLENE 0 TP 1 60 (SUTURE) IMPLANT
SUT VIC AB 0 CT1 27 (SUTURE) ×6
SUT VIC AB 0 CT1 27XBRD ANBCTR (SUTURE) ×3 IMPLANT
SUT VIC AB 1 CTX 36 (SUTURE) ×4
SUT VIC AB 1 CTX36XBRD ANBCTRL (SUTURE) ×2 IMPLANT
SUT VIC AB 3-0 SH 27 (SUTURE)
SUT VIC AB 3-0 SH 27X BRD (SUTURE) IMPLANT
SUT VICRYL 4-0 PS2 18IN ABS (SUTURE) IMPLANT
TOWEL OR 17X24 6PK STRL BLUE (TOWEL DISPOSABLE) ×4 IMPLANT
TRAY FOLEY CATH 14FR (SET/KITS/TRAYS/PACK) ×2 IMPLANT
WATER STERILE IRR 1000ML POUR (IV SOLUTION) ×2 IMPLANT

## 2010-11-29 NOTE — Anesthesia Procedure Notes (Addendum)
Spinal Block  Patient location during procedure: OR Start time: 11/29/2010 7:01 AM Staffing Anesthesiologist: FOSTER, MICHAEL A. Performed by: anesthesiologist  Preanesthetic Checklist Completed: patient identified, site marked, surgical consent, pre-op evaluation, timeout performed, IV checked, risks and benefits discussed and monitors and equipment checked Spinal Block Patient position: sitting Prep: site prepped and draped and DuraPrep Patient monitoring: heart rate, cardiac monitor, continuous pulse ox and blood pressure Approach: midline Location: L3-4 Injection technique: single-shot Needle Needle type: Sprotte  Needle gauge: 24 G Needle length: 9 cm Needle insertion depth: 4.5 cm Assessment Sensory level: T4 Additional Notes Pt. Tolerated procedure well.

## 2010-11-29 NOTE — Progress Notes (Signed)
Thinks her water broke @ 0215, pink fluid.   G7, P2.   2 previous c/s.  Scheduled for c/s @ 0730 this morning

## 2010-11-29 NOTE — Progress Notes (Signed)
  VS Stable and seems to be doing well.  Breast feeding.  Minimal bleeding.  Dressing dry.

## 2010-11-29 NOTE — Transfer of Care (Signed)
Immediate Anesthesia Transfer of Care Note  Patient: Candace Mendez  Procedure(s) Performed:  CESAREAN SECTION - Repeat   Patient Location: PACU  Anesthesia Type: Spinal  Level of Consciousness: awake, alert  and oriented  Airway & Oxygen Therapy: Patient Spontanous Breathing  Post-op Assessment: Report given to PACU RN and Post -op Vital signs reviewed and stable  Post vital signs: stable  Complications: No apparent anesthesia complications

## 2010-11-29 NOTE — Consult Note (Signed)
Basic Teaching done.  Discussed frequent feedings and hand expression as a way to increase MS in first few days.

## 2010-11-29 NOTE — Progress Notes (Signed)
Presented with SROM earlier this AM..  Re-check on Plts 127K.  All ready to proceed with Rpt C/S.

## 2010-11-29 NOTE — Consult Note (Signed)
Called to attend elective repeat C/S at term gestation. Mother was scheduled for procedure this AM when while at home membranes ruptured; admitted to hospital and taken to OR. NO risk factors. Infant in vertex with loose nuchal cord x 1, easily reduced.  Spontaneous cries and active MAE following delivery. Given tactile stim with drying and bulb suction of naso/oropharynx.  No dysmorphic features. Infant voided and had meconium stool.  Shown to mother and then carried to Transitional Nursery in warm blanket by father. Care to Dr. Ardith Dark MD Attending [facility]

## 2010-11-29 NOTE — OR Nursing (Signed)
Placenta to OR Utility, cord blood X 2 to lab

## 2010-11-29 NOTE — Anesthesia Postprocedure Evaluation (Signed)
Vital signs stable Patient alert Pain and nausea are controlled No apparent anesthetic complications No follow up care needed 

## 2010-11-29 NOTE — Op Note (Signed)
Candace Mendez is a gravida 3 para 2 previous cesarean section x2 who is scheduled for a repeat cesarean section electively on 7/16( due date 7/22 ).   Her membranes ruptured spontaneously and she began having contractions and she presented to maternity admissions around 3 AM on the morning of 7/16. Fetal heart was reactive. After being aware that the patient was in maternity admissions with ruptured membranes and contractions I decided to do cesarean section before the scheduled time of 7:30 and made arrangements accordingly.  Preoperative diagnoses-term pregnancy previous cesarean section x2 spontaneous rupture of membranes and labor  Postoperative diagnoses same plus delivery of 7 lbs. 5 oz. Female infant Apgars 9 and 9  Surgeon-Dr. Aldona Bar  Anesthesia subarachnoid block Dr. Malen Gauze  Procedure: Patient was taken to the operating room where a spinal anesthetic was carried out without difficulty by Dr. Malen Gauze and thereafter the patient was positioned in the supine position slightly tilted to the left. Fetal heart was well heard and once good anesthetic levels were documented, the patient was prepped and draped in usual fashion with a Foley catheter placed as part of the prep. At this time the procedure was begun.  A Pfannenstiel incision was made and dissected down to and through the fascia in a low transverse fashion with hemostasis created at each layer. Subfascial space was created inferiorly and superiorly and muscles were separated in the midline and peritoneum identified and entered appropriately with care taken to avoid the bowel superiorly and the bladder inferiorly. At this time the lower segment was well identified and appeared extremely thin. Indeed, upon incising the lower transverse segment membranes were encountered. The incision was extended laterally,  membranes ruptured with production of clear fluid,  and thereafter from a vertex position a viable female which cried spontaneously at once was  delivered.  It was passed off to the waiting team head up by Dr. Alison Murray after the cord was clamped and cut. Subsequent weight was found to be 7 lbs. 5 oz.  Apgars were 9 and 9. Cord bloods were collected and the placenta was thereafter delivered without difficulty. Good uterine contractility was afforded with slowly given intravenous Pitocin and manual stimulation.  At this time the uterus was explored and were rendered free of any remaining products of conception. Closure of the uterine incision was then carried out using #1 Vicryl in a running locking fashion and this was oversewn with several figure-of-eight #1 Vicryl for additional hemostasis. Incision at this time was noted to be dry, uterus is well contracted, tubes and ovaries appeared normal, and after the abdomen  was lavaged of all free blood and clot and all counts were noted to be correct and no foreign bodies were noted to be remaining in the abdominal cavity, closure of the abdomen was carried out in layers.  The abdominal peritoneum was closed with 0 Vicryl in a running fashion and muscles secured the same. Assured of good subfascial hemostasis, the fascia was then reapproximated with from angle to midline bilaterally with 0 Vicryl in a running fashion area and subcutaneous tissue was rendered hemostatic and thereafter the skin was closed with staples and a sterile pressure dressing applied.  Patient was taken to the recovery room in satisfactory condition having tolerated future well. Estimated blood loss 500 cc. All counts correct x2. At the conclusion of the procedure both mother and baby were doing well in the respective recovery areas.

## 2010-11-30 LAB — CBC
MCV: 90.9 fL (ref 78.0–100.0)
Platelets: 112 10*3/uL — ABNORMAL LOW (ref 150–400)
RBC: 2.85 MIL/uL — ABNORMAL LOW (ref 3.87–5.11)
RDW: 13.7 % (ref 11.5–15.5)
WBC: 9.8 10*3/uL (ref 4.0–10.5)

## 2010-11-30 LAB — RPR: RPR Ser Ql: NONREACTIVE

## 2010-11-30 MED ORDER — FERROUS SULFATE 325 (65 FE) MG PO TABS
325.0000 mg | ORAL_TABLET | Freq: Two times a day (BID) | ORAL | Status: DC
  Administered 2010-11-30 – 2010-12-01 (×3): 325 mg via ORAL
  Filled 2010-11-30 (×3): qty 1

## 2010-11-30 NOTE — Progress Notes (Addendum)
Stopped in to see Candace Mendez and all seems to be progressing well.  To shower and D/C dressing later today.  Breast feeding well and AM labs today OK (Hgb. 9.0 - 11.7 pre op, WBC 9.8 and Platelets 112K- 127K pre op)).  Tolerating diet well and voiding OK.

## 2010-11-30 NOTE — Progress Notes (Signed)
Subjective: Postpartum Day 1: Cesarean Delivery Patient reports Pt has no complaints.  Ambulating well.  Is hypotensive but asymptomatic.  Also anemic.    Objective: Vital signs in last 24 hours: Temp:  [97.5 F (36.4 C)-98.2 F (36.8 C)] 97.8 F (36.6 C) (07/17 0400) Pulse Rate:  [54-82] 62  (07/17 0400) Resp:  [16-20] 20  (07/17 0400) BP: (88-107)/(44-65) 90/58 mmHg (07/17 0400) SpO2:  [97 %-99 %] 98 % (07/17 0400)  Physical Exam:  General: alert, cooperative and no distress Lochia: appropriate Uterine Fundus: firm Incision: no significant drainage DVT Evaluation:    Basename 11/30/10 0520 11/29/10 0521  HGB 9.0* 11.7*  HCT 25.9* 33.6*    Assessment/Plan: Status post Cesarean section. Doing well postoperatively.  Will make sure that patient is on Iron.  Jaedyn Lard E 11/30/2010, 7:56 AM

## 2010-12-01 NOTE — Progress Notes (Signed)
Assisted with deeper latch. No questions at present

## 2010-12-01 NOTE — Progress Notes (Signed)
  Post Op Day 2 Subjective: up ad lib, voiding, tolerating PO and + flatus  Objective: Blood pressure 98/62, pulse 76, temperature 98 F (36.7 C), temperature source Oral, resp. rate 16, height 5\' 6"  (1.676 m), weight 78.019 kg (172 lb), SpO2 100.00%, unknown if currently breastfeeding.  Physical Exam:  General: alert and no distress Lochia: appropriate Uterine Fundus: firm Incision: Closed, no inflammation, small amount of fresh blood noted but no active bleeding cite identified    Assessment/Plan: Plan for discharge tomorrow   Mickel Baas 12/01/2010, 2:01 AM

## 2010-12-01 NOTE — Discharge Summary (Signed)
Obstetric Discharge Summary Reason for Admission: onset of labor, cesarean section and rupture of membranes Prenatal Procedures: none Intrapartum Procedures: cesarean: low cervical, transverse Postpartum Procedures: none Complications-Operative and Postpartum: none  Hemoglobin  Date Value Range Status  11/30/2010 9.0* 12.0-15.0 (g/dL) Final     DELTA CHECK NOTED     REPEATED TO VERIFY     HCT  Date Value Range Status  11/30/2010 25.9* 36.0-46.0 (%) Final    Discharge Diagnoses: Term Pregnancy-delivered, Previous Cesarean x 2, Onset of labor  Discharge Information: Date: 12/01/2010 Activity: Slow return to normal activity, Pelvic rest Diet: routine Medications: Percocet Condition: improved Instructions: refer to practice specific booklet Discharge to: home   Newborn Data: Live born  Information for the patient's newborn:  Candace Mendez, [place] [[id number]]  female Apgar 9/9; Weight: no recorded in delivery summary Home with mother.  Richey Doolittle D 12/01/2010, 12:14 PM

## 2010-12-16 ENCOUNTER — Encounter (HOSPITAL_COMMUNITY): Payer: Self-pay | Admitting: Obstetrics & Gynecology

## 2010-12-17 ENCOUNTER — Encounter (HOSPITAL_COMMUNITY): Payer: Self-pay | Admitting: *Deleted

## 2011-02-10 LAB — CBC
HCT: 20.6 — ABNORMAL LOW
HCT: 34.7 — ABNORMAL LOW
Hemoglobin: 12.2
Hemoglobin: 7.2 — CL
MCHC: 34.9
MCV: 93.2
MCV: 94.9
RBC: 2.17 — ABNORMAL LOW
RBC: 3.73 — ABNORMAL LOW
RDW: 13.5
WBC: 8.1

## 2011-02-15 LAB — COMPREHENSIVE METABOLIC PANEL
CO2: 21
Calcium: 9.4
Creatinine, Ser: 0.84
GFR calc Af Amer: >60
GFR calc non Af Amer: >60
Glucose, Bld: 111 — ABNORMAL HIGH
Total Protein: 6.3

## 2011-02-15 LAB — POCT I-STAT, CHEM 8
BUN: 15
Chloride: 105
Potassium: 3.7
Sodium: 137

## 2011-02-15 LAB — URINALYSIS, ROUTINE W REFLEX MICROSCOPIC
Bilirubin Urine: NEGATIVE
Glucose, UA: NEGATIVE
Ketones, ur: NEGATIVE
Protein, ur: NEGATIVE
Urobilinogen, UA: 0.2

## 2011-02-15 LAB — CBC
Hemoglobin: 14
MCHC: 33.5
MCV: 92.2
RBC: 4.52
RDW: 13.1

## 2011-02-15 LAB — DIFFERENTIAL
Lymphocytes Relative: 25
Lymphs Abs: 2.5
Neutrophils Relative %: 68

## 2011-02-15 LAB — POCT PREGNANCY, URINE: Preg Test, Ur: NEGATIVE

## 2011-02-15 LAB — LIPASE, BLOOD: Lipase: 22

## 2011-02-15 LAB — URINE CULTURE: Colony Count: NO GROWTH

## 2011-02-15 LAB — URINE MICROSCOPIC-ADD ON

## 2011-03-03 LAB — CBC
HCT: 34.3 — ABNORMAL LOW
Hemoglobin: 11.8 — ABNORMAL LOW
MCV: 92.2
RBC: 3.73 — ABNORMAL LOW
WBC: 10.4

## 2011-03-03 LAB — ABO/RH: ABO/RH(D): O POS

## 2011-03-03 LAB — TYPE AND SCREEN: ABO/RH(D): O POS

## 2012-07-16 ENCOUNTER — Other Ambulatory Visit: Payer: Self-pay | Admitting: Oncology

## 2012-08-20 ENCOUNTER — Other Ambulatory Visit: Payer: Self-pay | Admitting: Obstetrics & Gynecology

## 2013-10-28 ENCOUNTER — Other Ambulatory Visit: Payer: Self-pay | Admitting: Obstetrics & Gynecology

## 2013-10-29 LAB — CYTOLOGY - PAP

## 2014-03-17 ENCOUNTER — Encounter (HOSPITAL_COMMUNITY): Payer: Self-pay | Admitting: *Deleted

## 2016-02-08 ENCOUNTER — Other Ambulatory Visit: Payer: Self-pay | Admitting: Obstetrics & Gynecology

## 2016-02-09 LAB — CYTOLOGY - PAP

## 2016-02-10 ENCOUNTER — Other Ambulatory Visit: Payer: Self-pay | Admitting: Obstetrics & Gynecology

## 2016-02-10 DIAGNOSIS — R928 Other abnormal and inconclusive findings on diagnostic imaging of breast: Secondary | ICD-10-CM

## 2016-02-16 ENCOUNTER — Ambulatory Visit
Admission: RE | Admit: 2016-02-16 | Discharge: 2016-02-16 | Disposition: A | Discharge status: Home / Self Care | Payer: 59 | Source: Ambulatory Visit | Attending: Obstetrics & Gynecology | Admitting: Obstetrics & Gynecology

## 2016-02-16 DIAGNOSIS — R928 Other abnormal and inconclusive findings on diagnostic imaging of breast: Secondary | ICD-10-CM

## 2018-06-11 ENCOUNTER — Encounter: Payer: Self-pay | Admitting: *Deleted

## 2018-06-12 ENCOUNTER — Encounter: Payer: Self-pay | Admitting: Neurology

## 2018-06-12 ENCOUNTER — Ambulatory Visit (INDEPENDENT_AMBULATORY_CARE_PROVIDER_SITE_OTHER): Payer: 59 | Admitting: Neurology

## 2018-06-12 VITALS — BP 113/74 | HR 72 | Ht 60.0 in | Wt 149.2 lb

## 2018-06-12 DIAGNOSIS — IMO0002 Reserved for concepts with insufficient information to code with codable children: Secondary | ICD-10-CM

## 2018-06-12 DIAGNOSIS — G43709 Chronic migraine without aura, not intractable, without status migrainosus: Secondary | ICD-10-CM

## 2018-06-12 MED ORDER — NORTRIPTYLINE HCL 10 MG PO CAPS: 20.0000 mg | ORAL_CAPSULE | Freq: Every day | ORAL | 11 refills | Status: DC

## 2018-06-12 MED ORDER — SUMATRIPTAN SUCCINATE 50 MG PO TABS: 50.0000 mg | ORAL_TABLET | ORAL | 6 refills | Status: DC | PRN
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
## 2018-06-12 NOTE — Patient Instructions (Signed)
Magnesium oxide 476m twice a day Riboflavin 1035m=B2 twice a day

## 2018-09-06 ENCOUNTER — Telehealth: Payer: Self-pay

## 2018-09-06 NOTE — Telephone Encounter (Signed)
Patient has given verbal consent to file her insurance and to do a webex meeting. E-mail has been confirmed and sent.

## 2018-09-11 ENCOUNTER — Other Ambulatory Visit: Payer: Self-pay

## 2018-09-11 ENCOUNTER — Encounter: Payer: Self-pay | Admitting: Neurology

## 2018-09-11 ENCOUNTER — Ambulatory Visit (INDEPENDENT_AMBULATORY_CARE_PROVIDER_SITE_OTHER): Payer: 59 | Admitting: Neurology

## 2018-09-11 DIAGNOSIS — G43709 Chronic migraine without aura, not intractable, without status migrainosus: Secondary | ICD-10-CM

## 2018-09-11 DIAGNOSIS — IMO0002 Reserved for concepts with insufficient information to code with codable children: Secondary | ICD-10-CM

## 2018-09-11 MED ORDER — DICLOFENAC POTASSIUM(MIGRAINE) 50 MG PO PACK: 50.0000 mg | PACK | ORAL | 5 refills | Status: DC | PRN
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
## 2018-09-12 ENCOUNTER — Telehealth: Payer: Self-pay | Admitting: Neurology

## 2018-09-12 ENCOUNTER — Telehealth: Payer: Self-pay

## 2018-09-12 MED ORDER — DICLOFENAC POTASSIUM(MIGRAINE) 50 MG PO PACK: 50.0000 mg | PACK | ORAL | 3 refills | Status: DC | PRN

## 2018-09-12 NOTE — Telephone Encounter (Signed)
Received paperwork to do a PA for Cambia. But as soon as you go to pull it up on covermymeds her insurance company has already denied the medication at there is a 30% chance that it will get approved. They listed to the following mediations that doesn't require a PA:  Diclofenac Potassium: 21% Diclofenac Sodium: 40% Meloxicam: 43% Naproxen: 54%  Do you still want me to proceed to do the PA. Please advise

## 2018-09-12 NOTE — Telephone Encounter (Signed)
Lets try diclofenac potassium generic 50 mg pack, 9 packs, 30 day prescription as needed of acute headache. If not approved I think you can get Cambia 4 packs through [facility] for $ 20. May want to offer that to the patient.

## 2018-09-18 NOTE — Progress Notes (Signed)
I have reviewed and agreed above plan. 

## 2018-09-20 NOTE — Telephone Encounter (Signed)
Pt called and stated a PA needs to be called into pharmacy , she states pharmacy stated they havent heard anything back

## 2018-09-20 NOTE — Telephone Encounter (Signed)
PA has been denied. In the process of doing an appeal.

## 2018-09-25 ENCOUNTER — Telehealth: Payer: Self-pay

## 2018-09-25 NOTE — Telephone Encounter (Signed)
Patient has stated that in her mychart message that she is experiencing a increase in headache which come very close to becoming migraines over the last week or 2. Please advise.

## 2018-09-25 NOTE — Telephone Encounter (Signed)
I called the patient and left a message asking her to call back to discuss her headaches.

## 2018-09-25 NOTE — Telephone Encounter (Signed)
I called the patient to discuss her my chart message about her headaches. I left a message asking her to call the office back.

## 2018-09-26 ENCOUNTER — Telehealth: Payer: Self-pay | Admitting: Neurology

## 2018-09-26 MED ORDER — DICLOFENAC POTASSIUM(MIGRAINE) 50 MG PO PACK: 50.0000 mg | PACK | ORAL | 5 refills | Status: DC | PRN

## 2018-09-26 NOTE — Telephone Encounter (Signed)
I called the patient.  She reports she having more frequent headache.  She has been waking up every morning with a dull headache that progresses throughout the day.  She says this is not advancing to a true migraine in the sense of having to go into her room, lying in a dark room.  She has been taking Excedrin Migraine, multiple times during the day.  She is complaining of what she describes as a "cluster" headache around her eyes that develops at the end of the day.  We discussed options for headache management.  At this point she wants to do a gradual stepwise change.  She will increase nortriptyline to 30 mg at bedtime.  We are trying to get Cambia approved for headache prevention.  She does not like the way triptans make her feel.  I did suggest that she avoid Excedrin Migraine, could have a rebound component of headache. If not better in 1 week, she is to call and we may add Topamax. I will send in [place] through [facility] in [place], [place].

## 2018-10-01 ENCOUNTER — Telehealth: Payer: Self-pay | Admitting: *Deleted

## 2018-10-01 NOTE — Telephone Encounter (Signed)
LMVM for pt to return call to schedule follow up appointment. When returns call please make appointment.

## 2018-10-04 NOTE — Telephone Encounter (Signed)
Unable to get in contact with the patient to discuss if she was able to get her prescription from [facility]. I left a voicemail asking her to return my call. Office number was provided. I will also send a mychart message to the patient.

## 2018-11-01 ENCOUNTER — Encounter: Payer: Self-pay | Admitting: Neurology

## 2018-11-01 MED ORDER — NORTRIPTYLINE HCL 10 MG PO CAPS: 30.0000 mg | ORAL_CAPSULE | Freq: Every day | ORAL | 11 refills | Status: DC

## 2018-11-01 NOTE — Telephone Encounter (Signed)
I have sent in a new prescription for nortriptyline 30 mg at bedtime for headache.

## 2019-01-03 ENCOUNTER — Other Ambulatory Visit: Payer: Self-pay | Admitting: Neurology

## 2019-01-08 ENCOUNTER — Other Ambulatory Visit: Payer: Self-pay

## 2019-01-08 DIAGNOSIS — Z20822 Contact with and (suspected) exposure to covid-19: Secondary | ICD-10-CM

## 2019-01-09 LAB — NOVEL CORONAVIRUS, NAA: SARS-CoV-2, NAA: NOT DETECTED

## 2019-01-14 ENCOUNTER — Ambulatory Visit: Payer: 59 | Admitting: Neurology
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
## 2019-02-19 ENCOUNTER — Other Ambulatory Visit: Payer: Self-pay

## 2019-02-19 ENCOUNTER — Encounter: Payer: Self-pay | Admitting: Neurology

## 2019-02-19 ENCOUNTER — Ambulatory Visit (INDEPENDENT_AMBULATORY_CARE_PROVIDER_SITE_OTHER): Payer: 59 | Admitting: Neurology

## 2019-02-19 VITALS — BP 113/75 | HR 87 | Temp 97.3°F | Ht 60.0 in | Wt 146.8 lb

## 2019-02-19 DIAGNOSIS — G43709 Chronic migraine without aura, not intractable, without status migrainosus: Secondary | ICD-10-CM

## 2019-02-19 DIAGNOSIS — IMO0002 Reserved for concepts with insufficient information to code with codable children: Secondary | ICD-10-CM

## 2019-02-19 NOTE — Patient Instructions (Signed)
1. Continue nortriptyline 30 mg at bedtime  2. Continue Excedrin Migraine  3. Return in 1 year

## 2019-02-19 NOTE — Progress Notes (Signed)
I have reviewed and agreed above plan. 

## 2019-04-16 ENCOUNTER — Other Ambulatory Visit: Payer: Self-pay

## 2019-04-16 DIAGNOSIS — Z20822 Contact with and (suspected) exposure to covid-19: Secondary | ICD-10-CM

## 2019-04-18 LAB — NOVEL CORONAVIRUS, NAA: SARS-CoV-2, NAA: DETECTED — AB

## 2019-10-01 ENCOUNTER — Encounter: Payer: Self-pay | Admitting: Neurology

## 2019-10-01 ENCOUNTER — Other Ambulatory Visit: Payer: Self-pay | Admitting: Neurology

## 2019-10-01 MED ORDER — NORTRIPTYLINE HCL 10 MG PO CAPS: 40.0000 mg | ORAL_CAPSULE | Freq: Every day | ORAL | 4 refills | Status: DC

## 2019-10-01 NOTE — Progress Notes (Signed)
Increased nortriptyline 40 mg at bedtime per my chart message.

## 2020-01-12 ENCOUNTER — Encounter: Payer: Self-pay | Admitting: Neurology

## 2020-02-19 ENCOUNTER — Encounter: Payer: Self-pay | Admitting: Neurology

## 2020-02-19 ENCOUNTER — Other Ambulatory Visit: Payer: Self-pay

## 2020-02-19 ENCOUNTER — Ambulatory Visit (INDEPENDENT_AMBULATORY_CARE_PROVIDER_SITE_OTHER): Payer: 59 | Admitting: Neurology

## 2020-02-19 VITALS — BP 118/72 | HR 90 | Ht 60.0 in | Wt 153.0 lb

## 2020-02-19 DIAGNOSIS — G43709 Chronic migraine without aura, not intractable, without status migrainosus: Secondary | ICD-10-CM | POA: Diagnosis not present

## 2020-02-19 MED ORDER — NORTRIPTYLINE HCL 10 MG PO CAPS: 40.0000 mg | ORAL_CAPSULE | Freq: Every day | ORAL | 4 refills | Status: DC
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
## 2020-02-19 NOTE — Patient Instructions (Signed)
You look great! Continue the nortriptyline at current dosing Take Excedrin migraine as needed See you back in 1 year

## 2020-03-14 ENCOUNTER — Other Ambulatory Visit: Payer: Self-pay | Admitting: Neurology

## 2020-03-17 NOTE — Progress Notes (Signed)
I have reviewed and agreed above plan. 

## 2021-02-18 ENCOUNTER — Ambulatory Visit: Payer: 59 | Admitting: Neurology

## 2021-05-17 ENCOUNTER — Other Ambulatory Visit: Payer: Self-pay | Admitting: Neurology

## 2021-05-19 ENCOUNTER — Telehealth: Payer: Self-pay | Admitting: Neurology

## 2021-05-19 ENCOUNTER — Other Ambulatory Visit: Payer: Self-pay

## 2021-05-19 MED ORDER — NORTRIPTYLINE HCL 10 MG PO CAPS: 40.0000 mg | ORAL_CAPSULE | Freq: Every day | ORAL | 0 refills | Status: DC

## 2021-05-19 NOTE — Progress Notes (Signed)
Rx refilled.

## 2021-05-19 NOTE — Telephone Encounter (Signed)
Rx refilled.

## 2021-05-19 NOTE — Telephone Encounter (Signed)
Pt is needing a refill request for her nortriptyline (PAMELOR) 10 MG capsule sent to the [facility] in [facility]

## 2021-07-27 ENCOUNTER — Other Ambulatory Visit: Payer: Self-pay | Admitting: Obstetrics & Gynecology

## 2021-07-27 DIAGNOSIS — Z1231 Encounter for screening mammogram for malignant neoplasm of breast: Secondary | ICD-10-CM

## 2021-08-04 ENCOUNTER — Other Ambulatory Visit: Payer: Self-pay | Admitting: Orthopedic Surgery

## 2021-08-04 DIAGNOSIS — M75102 Unspecified rotator cuff tear or rupture of left shoulder, not specified as traumatic: Secondary | ICD-10-CM

## 2021-08-15 ENCOUNTER — Other Ambulatory Visit: Payer: 59

## 2021-08-18 ENCOUNTER — Other Ambulatory Visit: Payer: Self-pay | Admitting: Neurology

## 2021-08-23 ENCOUNTER — Ambulatory Visit
Admission: RE | Admit: 2021-08-23 | Discharge: 2021-08-23 | Disposition: A | Discharge status: Home / Self Care | Payer: 59 | Source: Ambulatory Visit | Attending: Orthopedic Surgery | Admitting: Orthopedic Surgery

## 2021-08-23 DIAGNOSIS — M75102 Unspecified rotator cuff tear or rupture of left shoulder, not specified as traumatic: Secondary | ICD-10-CM

## 2021-08-27 ENCOUNTER — Ambulatory Visit
Admission: RE | Admit: 2021-08-27 | Discharge: 2021-08-27 | Disposition: A | Discharge status: Home / Self Care | Payer: 59 | Source: Ambulatory Visit | Attending: Obstetrics & Gynecology | Admitting: Obstetrics & Gynecology

## 2021-08-27 DIAGNOSIS — Z1231 Encounter for screening mammogram for malignant neoplasm of breast: Secondary | ICD-10-CM

## 2021-09-06 ENCOUNTER — Telehealth: Payer: Self-pay | Admitting: Neurology

## 2021-09-06 MED ORDER — NORTRIPTYLINE HCL 10 MG PO CAPS: 40.0000 mg | ORAL_CAPSULE | Freq: Every day | ORAL | 0 refills | Status: DC

## 2021-09-06 NOTE — Telephone Encounter (Signed)
Pt called requesting to know why her nortriptyline (PAMELOR) 10 MG capsulemedication refill was refused.  ?Informed pt since she has no appt scheduled with [facility] office it was refused. ?Pt would like a call back and states that she cant come in because she is getting shoulder surgery. Pt states she was told she didn't need to come in for this medication refill.  ?[phone] ?

## 2021-09-06 NOTE — Telephone Encounter (Signed)
Spoke with patient and informed she needs yearly f/u. Scheduled pt appointment. Will fill until appointment date. ?

## 2021-09-14 ENCOUNTER — Encounter: Payer: Self-pay | Admitting: Neurology

## 2021-09-14 ENCOUNTER — Ambulatory Visit (INDEPENDENT_AMBULATORY_CARE_PROVIDER_SITE_OTHER): Payer: 59 | Admitting: Neurology

## 2021-09-14 VITALS — BP 101/66 | HR 55 | Ht 61.0 in | Wt 150.0 lb

## 2021-09-14 DIAGNOSIS — G43709 Chronic migraine without aura, not intractable, without status migrainosus: Secondary | ICD-10-CM

## 2021-09-14 MED ORDER — NORTRIPTYLINE HCL 10 MG PO CAPS: 40.0000 mg | ORAL_CAPSULE | Freq: Every day | ORAL | 3 refills | Status: DC
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
## 2021-09-15 LAB — COLOGUARD: COLOGUARD: NEGATIVE

## 2021-09-15 LAB — EXTERNAL GENERIC LAB PROCEDURE: COLOGUARD: NEGATIVE

## 2022-08-01 ENCOUNTER — Other Ambulatory Visit: Payer: Self-pay | Admitting: Obstetrics & Gynecology

## 2022-08-01 DIAGNOSIS — Z1231 Encounter for screening mammogram for malignant neoplasm of breast: Secondary | ICD-10-CM

## 2022-08-16 ENCOUNTER — Telehealth: Payer: Self-pay | Admitting: Neurology

## 2022-08-16 NOTE — Telephone Encounter (Signed)
LVM and sent mychart msg informing pt of need to reschedule 09/19/22 appointment - NP out

## 2022-08-31 ENCOUNTER — Telehealth: Payer: Self-pay | Admitting: Neurology

## 2022-08-31 NOTE — Telephone Encounter (Signed)
I received labs collected 415/24, TSH 1.650, A1c 5.2, cholesterol 173, LDL 96, creatinine 0.81, AST 16, ALT 18, Hgb 13.8, platelet 314.  All appear unremarkable.

## 2022-09-15 ENCOUNTER — Ambulatory Visit
Admission: RE | Admit: 2022-09-15 | Discharge: 2022-09-15 | Disposition: A | Discharge status: Home / Self Care | Payer: 59 | Source: Ambulatory Visit | Attending: Obstetrics & Gynecology | Admitting: Obstetrics & Gynecology

## 2022-09-15 ENCOUNTER — Other Ambulatory Visit: Payer: Self-pay | Admitting: Obstetrics & Gynecology

## 2022-09-15 DIAGNOSIS — Z1231 Encounter for screening mammogram for malignant neoplasm of breast: Secondary | ICD-10-CM

## 2022-09-16 ENCOUNTER — Other Ambulatory Visit: Payer: Self-pay | Admitting: Neurology

## 2022-09-19 ENCOUNTER — Telehealth: Payer: 59 | Admitting: Neurology

## 2022-09-21 ENCOUNTER — Telehealth: Payer: 59 | Admitting: Neurology

## 2022-10-24 ENCOUNTER — Telehealth (INDEPENDENT_AMBULATORY_CARE_PROVIDER_SITE_OTHER): Payer: 59 | Admitting: Neurology

## 2022-10-24 DIAGNOSIS — G43709 Chronic migraine without aura, not intractable, without status migrainosus: Secondary | ICD-10-CM

## 2022-10-24 MED ORDER — NORTRIPTYLINE HCL 10 MG PO CAPS: 40.0000 mg | ORAL_CAPSULE | Freq: Every day | ORAL | 3 refills | Status: DC

## 2022-10-24 NOTE — Patient Instructions (Signed)
Great to see you today! Please let me know if your headaches increase, will continue nortriptyline! See you in 1 year :)  Meds ordered this encounter  Medications   nortriptyline (PAMELOR) 10 MG capsule    Sig: Take 4 capsules (40 mg total) by mouth at bedtime.    Dispense:  360 capsule    Refill:  3
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
## 2023-01-17 ENCOUNTER — Other Ambulatory Visit: Payer: Self-pay | Admitting: Medical Genetics

## 2023-01-17 DIAGNOSIS — Z006 Encounter for examination for normal comparison and control in clinical research program: Secondary | ICD-10-CM

## 2023-03-10 ENCOUNTER — Other Ambulatory Visit (HOSPITAL_COMMUNITY)
Admission: RE | Admit: 2023-03-10 | Discharge: 2023-03-10 | Disposition: A | Discharge status: Home / Self Care | Payer: 59 | Source: Ambulatory Visit | Attending: Oncology | Admitting: Oncology

## 2023-03-10 DIAGNOSIS — Z006 Encounter for examination for normal comparison and control in clinical research program: Secondary | ICD-10-CM | POA: Insufficient documentation

## 2023-03-20 LAB — HELIX MOLECULAR SCREEN: Genetic Analysis Overall Interpretation: NEGATIVE

## 2023-03-20 LAB — GENECONNECT MOLECULAR SCREEN

## 2023-08-21 ENCOUNTER — Other Ambulatory Visit: Payer: Self-pay | Admitting: Obstetrics & Gynecology

## 2023-08-21 DIAGNOSIS — Z1231 Encounter for screening mammogram for malignant neoplasm of breast: Secondary | ICD-10-CM

## 2023-09-18 ENCOUNTER — Ambulatory Visit
Admission: RE | Admit: 2023-09-18 | Discharge: 2023-09-18 | Disposition: A | Discharge status: Home / Self Care | Source: Ambulatory Visit | Attending: Obstetrics & Gynecology | Admitting: Obstetrics & Gynecology

## 2023-09-18 DIAGNOSIS — Z1231 Encounter for screening mammogram for malignant neoplasm of breast: Secondary | ICD-10-CM
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
## 2023-10-24 ENCOUNTER — Telehealth (INDEPENDENT_AMBULATORY_CARE_PROVIDER_SITE_OTHER): Payer: 59 | Admitting: Neurology

## 2023-10-24 DIAGNOSIS — G43709 Chronic migraine without aura, not intractable, without status migrainosus: Secondary | ICD-10-CM | POA: Diagnosis not present

## 2023-10-24 MED ORDER — NORTRIPTYLINE HCL 25 MG PO CAPS: 50.0000 mg | ORAL_CAPSULE | Freq: Every day | ORAL | 3 refills | Status: AC

## 2023-10-24 NOTE — Patient Instructions (Signed)
 Great to see you today.  Will continue nortriptyline .  If your migraines increase please let me know.  Feel free to reach out via MyChart if needed.  Follow-up in 1 year.  Thanks!!

## 2023-12-24 ENCOUNTER — Other Ambulatory Visit: Payer: Self-pay | Admitting: Neurology

## 2024-10-23 ENCOUNTER — Telehealth: Admitting: Neurology
